# Patient Record
Sex: Female | Born: 1980 | Race: Black or African American | Hispanic: No | Marital: Single | Smoking: Former smoker
Health system: Southern US, Community
[De-identification: ages and names within clinical notes are randomized; demographics above are authoritative.]

## PROBLEM LIST (undated history)

## (undated) DIAGNOSIS — O24419 Gestational diabetes mellitus in pregnancy, unspecified control: Secondary | ICD-10-CM

## (undated) HISTORY — DX: Gestational diabetes mellitus in pregnancy, unspecified control: O24.419

---

## 2000-01-04 ENCOUNTER — Emergency Department (HOSPITAL_COMMUNITY): Admission: EM | Admit: 2000-01-04 | Discharge: 2000-01-05 | Payer: Self-pay | Admitting: Emergency Medicine

## 2000-03-14 ENCOUNTER — Emergency Department (HOSPITAL_COMMUNITY): Admission: EM | Admit: 2000-03-14 | Discharge: 2000-03-14 | Payer: Self-pay | Admitting: Emergency Medicine

## 2000-03-19 ENCOUNTER — Emergency Department (HOSPITAL_COMMUNITY): Admission: EM | Admit: 2000-03-19 | Discharge: 2000-03-19 | Payer: Self-pay | Admitting: Emergency Medicine

## 2000-04-29 ENCOUNTER — Emergency Department (HOSPITAL_COMMUNITY): Admission: EM | Admit: 2000-04-29 | Discharge: 2000-04-29 | Payer: Self-pay | Admitting: Emergency Medicine

## 2001-03-15 ENCOUNTER — Emergency Department (HOSPITAL_COMMUNITY): Admission: EM | Admit: 2001-03-15 | Discharge: 2001-03-15 | Payer: Self-pay | Admitting: Emergency Medicine

## 2001-09-04 ENCOUNTER — Emergency Department (HOSPITAL_COMMUNITY): Admission: EM | Admit: 2001-09-04 | Discharge: 2001-09-04 | Payer: Self-pay | Admitting: Emergency Medicine

## 2001-09-30 ENCOUNTER — Emergency Department (HOSPITAL_COMMUNITY): Admission: EM | Admit: 2001-09-30 | Discharge: 2001-09-30 | Payer: Self-pay | Admitting: Emergency Medicine

## 2001-10-13 ENCOUNTER — Emergency Department (HOSPITAL_COMMUNITY): Admission: EM | Admit: 2001-10-13 | Discharge: 2001-10-14 | Payer: Self-pay | Admitting: Emergency Medicine

## 2001-10-20 ENCOUNTER — Inpatient Hospital Stay (HOSPITAL_COMMUNITY): Admission: AD | Admit: 2001-10-20 | Discharge: 2001-10-20 | Payer: Self-pay | Admitting: *Deleted

## 2001-12-31 ENCOUNTER — Emergency Department (HOSPITAL_COMMUNITY): Admission: EM | Admit: 2001-12-31 | Discharge: 2001-12-31 | Payer: Self-pay | Admitting: Emergency Medicine

## 2002-01-23 ENCOUNTER — Emergency Department (HOSPITAL_COMMUNITY): Admission: EM | Admit: 2002-01-23 | Discharge: 2002-01-24 | Payer: Self-pay | Admitting: Emergency Medicine

## 2003-04-15 ENCOUNTER — Emergency Department (HOSPITAL_COMMUNITY): Admission: EM | Admit: 2003-04-15 | Discharge: 2003-04-15 | Payer: Self-pay | Admitting: Emergency Medicine

## 2003-08-30 ENCOUNTER — Inpatient Hospital Stay (HOSPITAL_COMMUNITY): Admission: AD | Admit: 2003-08-30 | Discharge: 2003-08-30 | Payer: Self-pay | Admitting: Gynecology

## 2007-11-26 ENCOUNTER — Emergency Department (HOSPITAL_COMMUNITY): Admission: EM | Admit: 2007-11-26 | Discharge: 2007-11-26 | Payer: Self-pay | Admitting: Emergency Medicine

## 2008-02-24 ENCOUNTER — Emergency Department (HOSPITAL_COMMUNITY): Admission: EM | Admit: 2008-02-24 | Discharge: 2008-02-24 | Payer: Self-pay | Admitting: Emergency Medicine

## 2008-02-25 ENCOUNTER — Emergency Department (HOSPITAL_COMMUNITY): Admission: EM | Admit: 2008-02-25 | Discharge: 2008-02-25 | Payer: Self-pay | Admitting: Emergency Medicine

## 2008-03-02 ENCOUNTER — Emergency Department (HOSPITAL_COMMUNITY): Admission: EM | Admit: 2008-03-02 | Discharge: 2008-03-02 | Payer: Self-pay | Admitting: Emergency Medicine

## 2008-03-22 ENCOUNTER — Encounter: Admission: RE | Admit: 2008-03-22 | Discharge: 2008-03-22 | Payer: Self-pay | Admitting: Emergency Medicine

## 2008-05-17 ENCOUNTER — Emergency Department (HOSPITAL_COMMUNITY): Admission: EM | Admit: 2008-05-17 | Discharge: 2008-05-17 | Payer: Self-pay | Admitting: Family Medicine

## 2008-10-12 ENCOUNTER — Emergency Department (HOSPITAL_COMMUNITY): Admission: EM | Admit: 2008-10-12 | Discharge: 2008-10-12 | Payer: Self-pay | Admitting: Family Medicine

## 2008-11-08 ENCOUNTER — Emergency Department (HOSPITAL_COMMUNITY): Admission: EM | Admit: 2008-11-08 | Discharge: 2008-11-08 | Payer: Self-pay | Admitting: Family Medicine

## 2008-12-10 ENCOUNTER — Emergency Department (HOSPITAL_COMMUNITY): Admission: EM | Admit: 2008-12-10 | Discharge: 2008-12-10 | Payer: Self-pay | Admitting: Family Medicine

## 2009-07-20 ENCOUNTER — Emergency Department (HOSPITAL_COMMUNITY): Admission: EM | Admit: 2009-07-20 | Discharge: 2009-07-20 | Payer: Self-pay | Admitting: Emergency Medicine

## 2009-11-18 IMAGING — CT CT ABDOMEN W/ CM
1 of 3 series · 14 of 32 positions shown, 19 images · IV contrast (APPLIED)
Comparison: None

CT ABDOMEN

CLINICAL DATA: Vomiting, lower abdominal pain.

CT ABDOMEN AND PELVIS WITH CONTRAST
TECHNIQUE: Multidetector CT imaging of the abdomen and pelvis was
performed using the standard protocol following bolus
administration of intravenous contrast.
Contrast: 100 ml 5mnipaque-UCC

[Series 2: abd/pelv with 5.0 b31f st · axial · 0.71mm/px · z∈[-436,-36]mm · 14 of 90 slices shown, 19 images]
[im 5/90  soft-tissue]
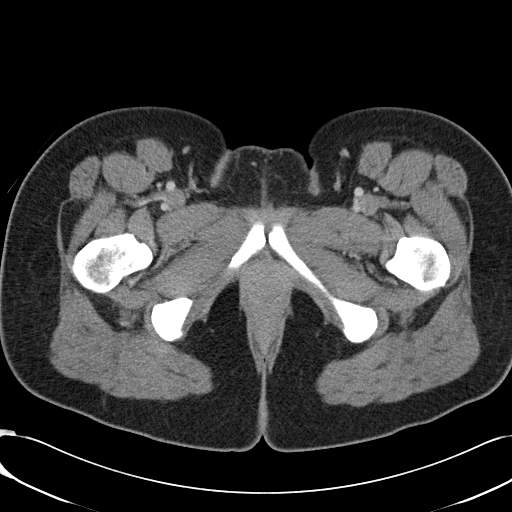
[im 5/90  bone]
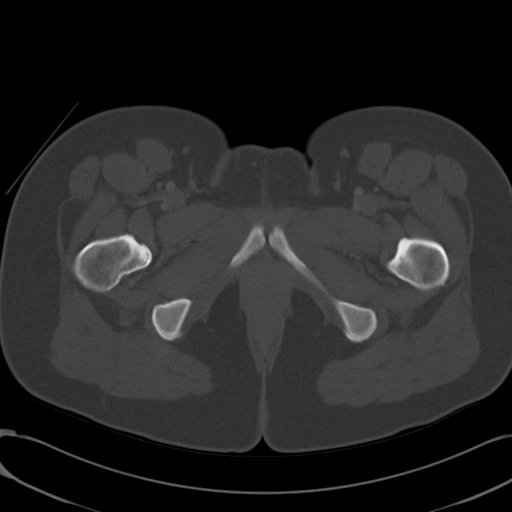
[im 14/90  soft-tissue]
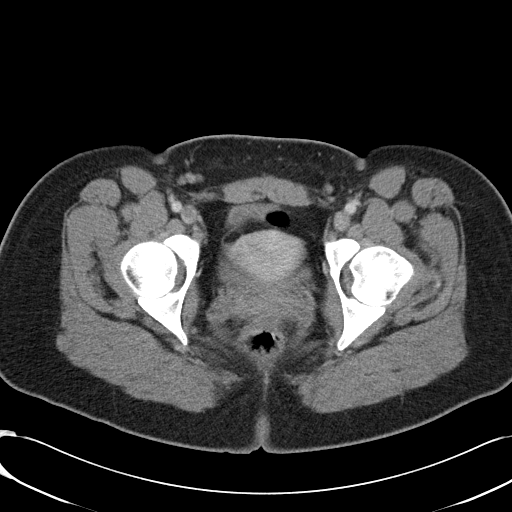
[im 18/90  soft-tissue]
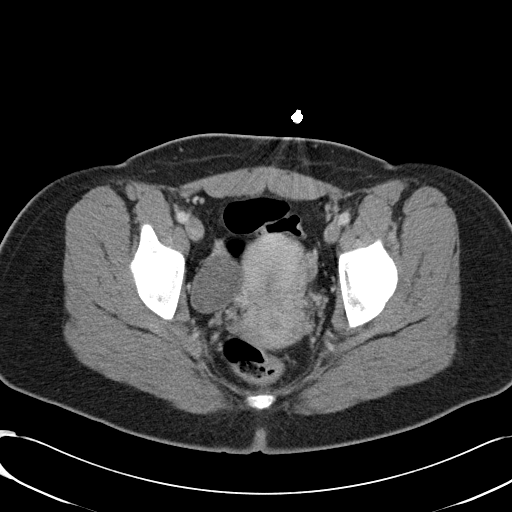
[im 27/90  soft-tissue]
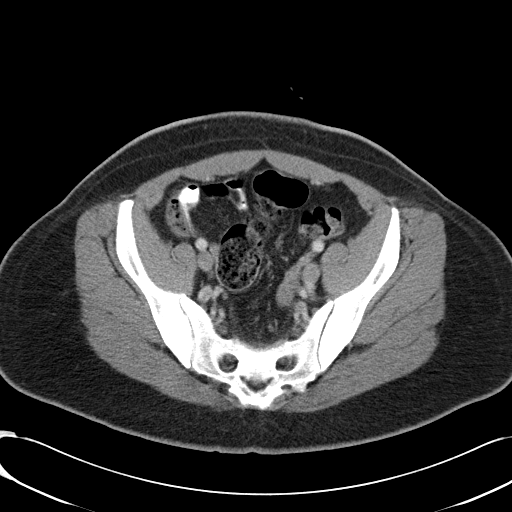
[im 32/90  soft-tissue]
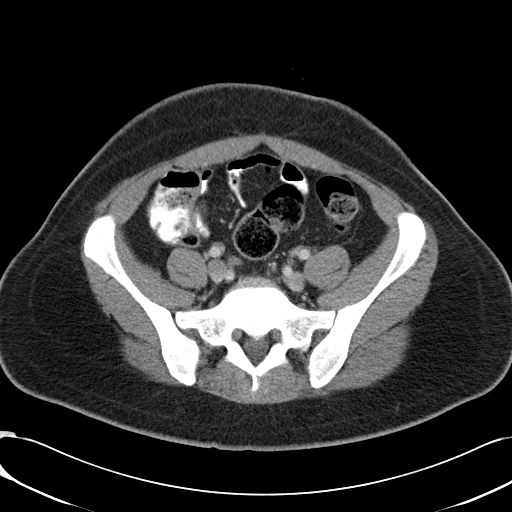
[im 41/90  soft-tissue]
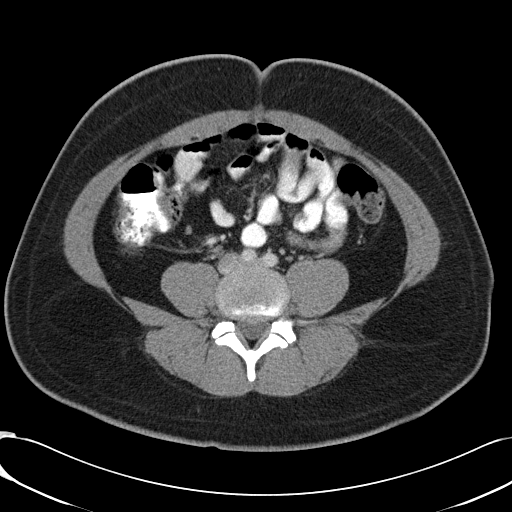
[im 45/90  soft-tissue]
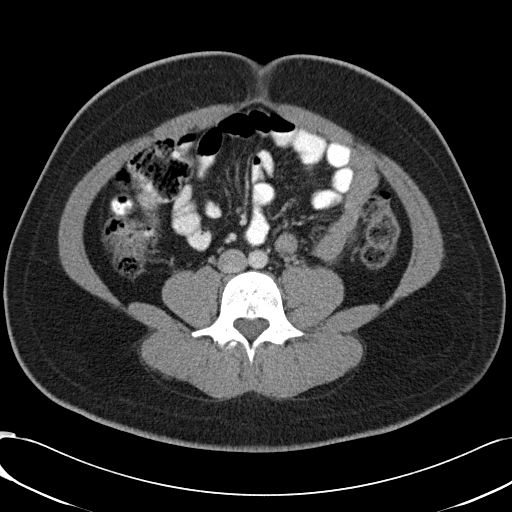
[im 49/90  soft-tissue]
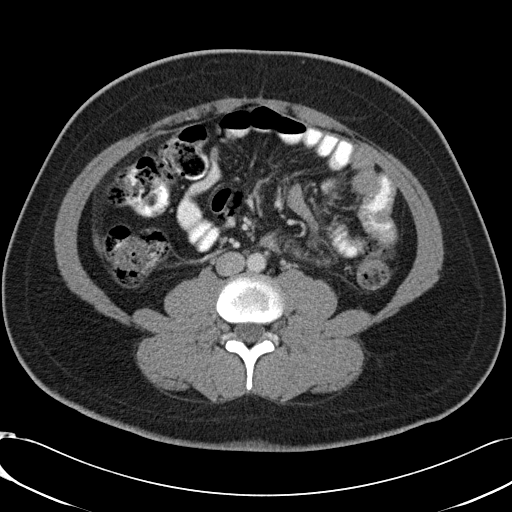
[im 58/90  soft-tissue]
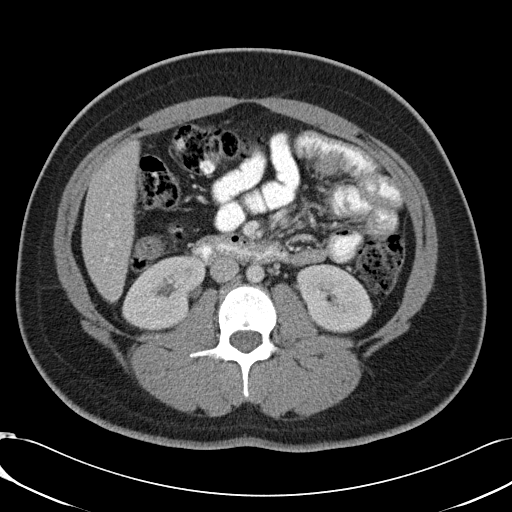
[im 58/90  bone]
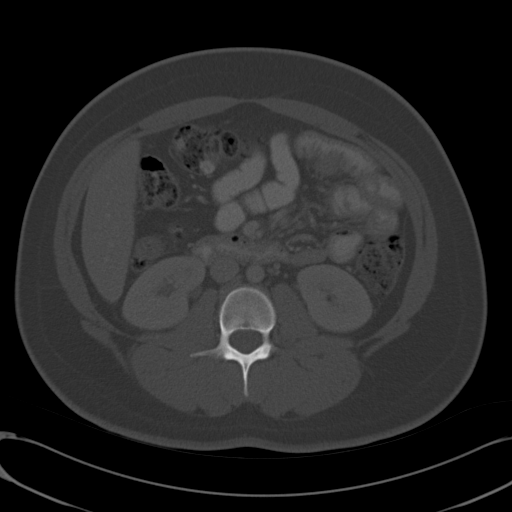
[im 63/90  soft-tissue]
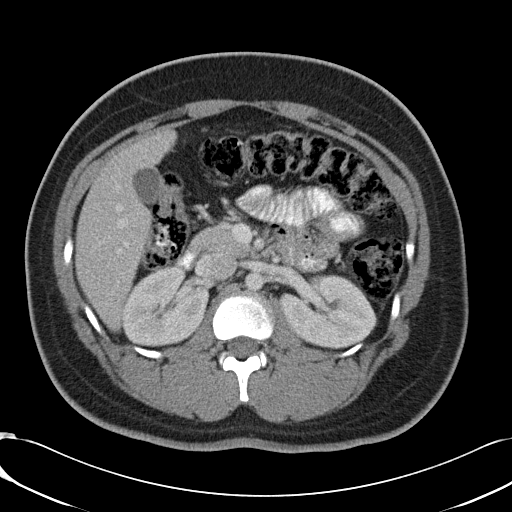
[im 72/90  soft-tissue]
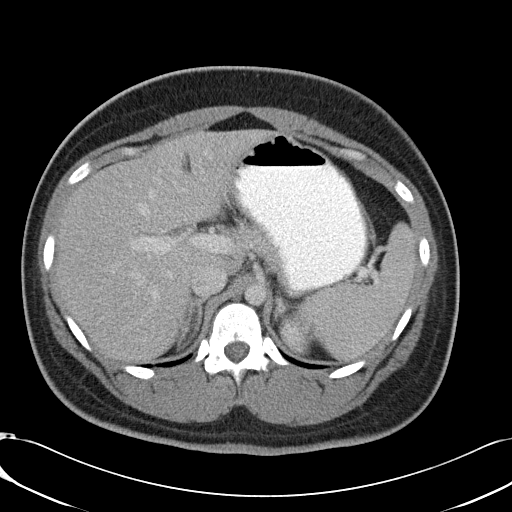
[im 72/90  lung]
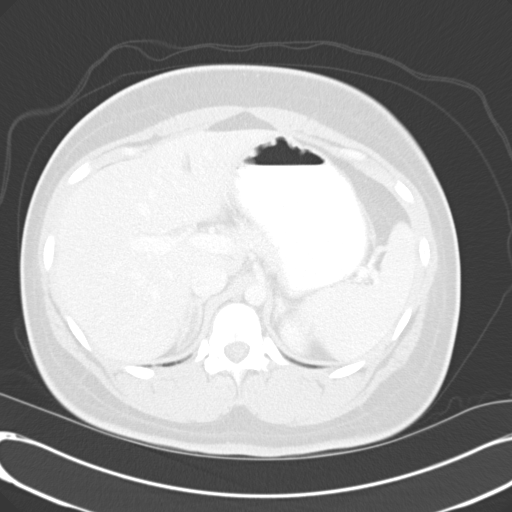
[im 76/90  soft-tissue]
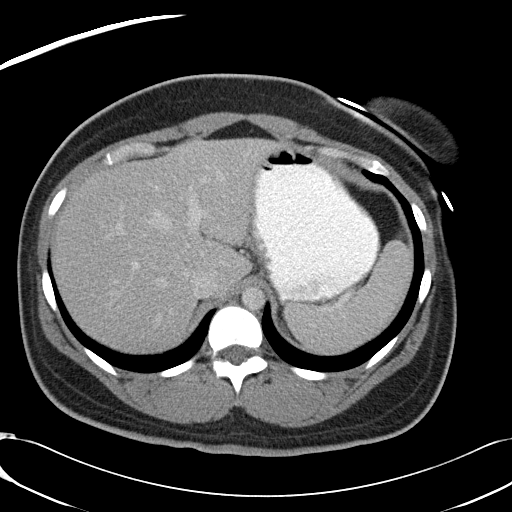
[im 76/90  lung]
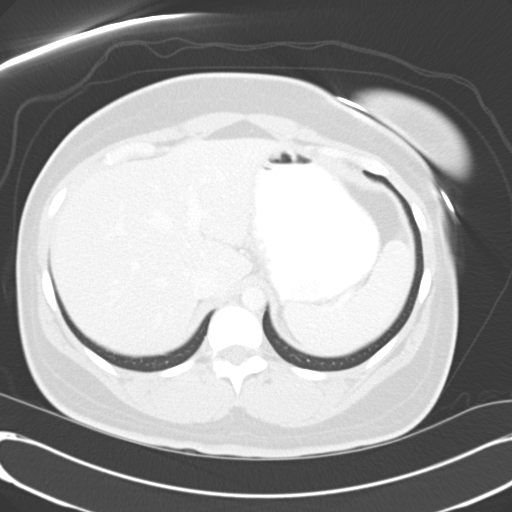
[im 81/90  lung]
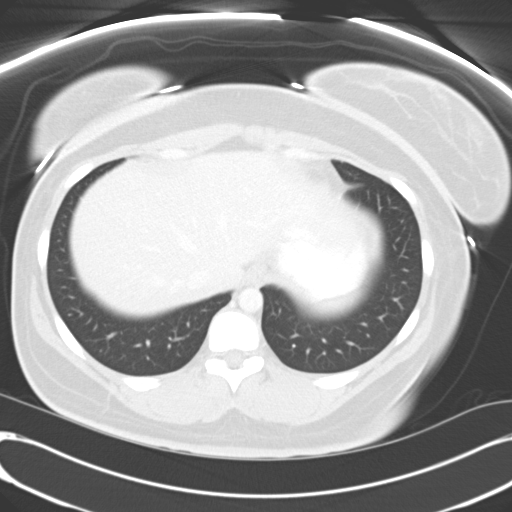
[im 85/90  soft-tissue]
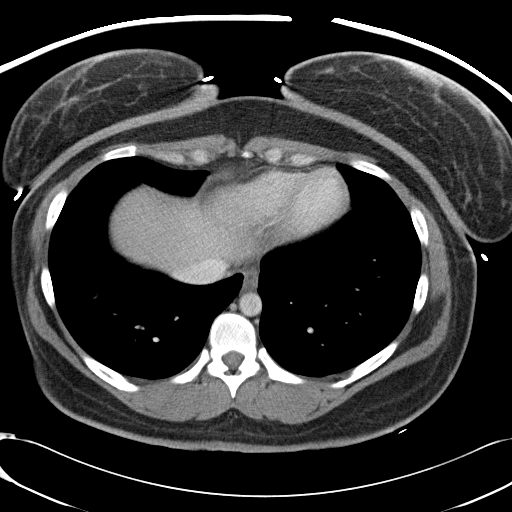
[im 85/90  lung]
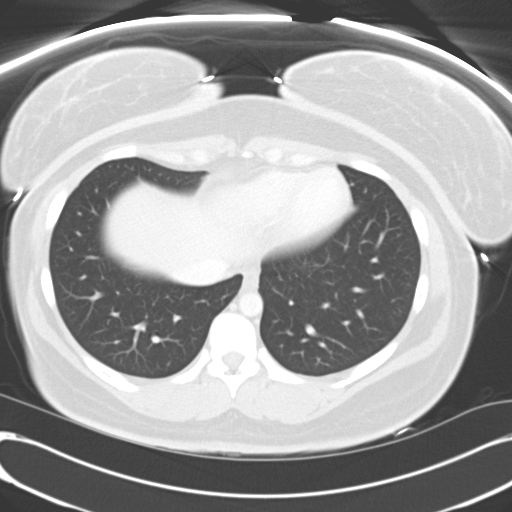

[14 of 32 positions shown; findings below may reference images not displayed]

FINDINGS: Liver, spleen, pancreas, adrenals, kidneys unremarkable.
No hydronephrosis. No biliary ductal dilatation. Gallbladder
unremarkable

Bowel grossly unremarkable.  No free fluid, free air, or
adenopathy. Aorta is normal caliber. Lung bases are clear.  No
effusions.  Heart is normal size. No acute bony abnormality.
IMPRESSION: Unremarkable CT abdomen

CT PELVIS
FINDINGS: Appendix is visualized and is normal. Bowel grossly
unremarkable.  No free fluid, free air, or adenopathy. Bilateral
ovarian cysts present, 4.1 cm on the right and 3.6 cm on the left.
Uterus unremarkable.

No acute bony abnormality.
IMPRESSION: Bilateral ovarian cysts.  These can be followed with pelvic
ultrasound in one to two cycles.

No acute findings.  Appendix normal.

## 2010-04-03 ENCOUNTER — Emergency Department (HOSPITAL_COMMUNITY)
Admission: EM | Admit: 2010-04-03 | Discharge: 2010-04-03 | Disposition: A | Discharge status: Home / Self Care | Attending: Emergency Medicine | Admitting: Emergency Medicine

## 2010-04-03 DIAGNOSIS — X58XXXA Exposure to other specified factors, initial encounter: Secondary | ICD-10-CM | POA: Insufficient documentation

## 2010-04-03 DIAGNOSIS — S335XXA Sprain of ligaments of lumbar spine, initial encounter: Secondary | ICD-10-CM | POA: Insufficient documentation

## 2010-04-03 DIAGNOSIS — Y929 Unspecified place or not applicable: Secondary | ICD-10-CM | POA: Insufficient documentation

## 2010-05-12 LAB — POCT URINALYSIS DIP (DEVICE)
Bilirubin Urine: NEGATIVE
Glucose, UA: NEGATIVE mg/dL
Hgb urine dipstick: NEGATIVE
Nitrite: NEGATIVE
Protein, ur: NEGATIVE mg/dL

## 2010-05-12 LAB — POCT PREGNANCY, URINE: Preg Test, Ur: NEGATIVE

## 2010-07-07 HISTORY — PX: BREAST REDUCTION SURGERY: SHX8

## 2010-08-02 ENCOUNTER — Inpatient Hospital Stay (INDEPENDENT_AMBULATORY_CARE_PROVIDER_SITE_OTHER)
Admission: RE | Admit: 2010-08-02 | Discharge: 2010-08-02 | Disposition: A | Discharge status: Home / Self Care | Source: Ambulatory Visit | Attending: Emergency Medicine | Admitting: Emergency Medicine

## 2010-08-02 DIAGNOSIS — H9209 Otalgia, unspecified ear: Secondary | ICD-10-CM

## 2010-11-07 LAB — URINALYSIS, ROUTINE W REFLEX MICROSCOPIC
Bilirubin Urine: NEGATIVE
Ketones, ur: NEGATIVE
Nitrite: NEGATIVE
Protein, ur: NEGATIVE
Specific Gravity, Urine: 1.025
Urobilinogen, UA: 1
pH: 6.5

## 2010-11-07 LAB — DIFFERENTIAL
Basophils Absolute: 0
Eosinophils Relative: 2
Lymphocytes Relative: 13
Lymphs Abs: 1
Monocytes Absolute: 0.6
Monocytes Relative: 8

## 2010-11-07 LAB — BASIC METABOLIC PANEL
CO2: 27
Creatinine, Ser: 0.81
GFR calc Af Amer: >60
Glucose, Bld: 100 — ABNORMAL HIGH
Sodium: 140

## 2010-11-07 LAB — CBC
Hemoglobin: 11.1 — ABNORMAL LOW
MCV: 74.9 — ABNORMAL LOW
Platelets: 258
RBC: 4.64
WBC: 8

## 2010-11-07 LAB — HEPATIC FUNCTION PANEL
ALT: 9
Alkaline Phosphatase: 56
Bilirubin, Direct: <0.1

## 2011-02-11 ENCOUNTER — Encounter: Payer: Self-pay | Admitting: Emergency Medicine

## 2011-02-11 ENCOUNTER — Emergency Department (INDEPENDENT_AMBULATORY_CARE_PROVIDER_SITE_OTHER)
Admission: EM | Admit: 2011-02-11 | Discharge: 2011-02-11 | Disposition: A | Discharge status: Home / Self Care | Source: Home / Self Care | Attending: Emergency Medicine | Admitting: Emergency Medicine

## 2011-02-11 DIAGNOSIS — S335XXA Sprain of ligaments of lumbar spine, initial encounter: Secondary | ICD-10-CM

## 2011-02-11 DIAGNOSIS — S39012A Strain of muscle, fascia and tendon of lower back, initial encounter: Secondary | ICD-10-CM

## 2011-02-11 MED ORDER — CYCLOBENZAPRINE HCL 5 MG PO TABS: 5.0000 mg | ORAL_TABLET | Freq: Three times a day (TID) | ORAL | Status: AC | PRN

## 2011-02-11 MED ORDER — DICLOFENAC SODIUM 75 MG PO TBEC: 75.0000 mg | DELAYED_RELEASE_TABLET | Freq: Two times a day (BID) | ORAL | Status: AC

## 2011-02-11 NOTE — ED Notes (Signed)
Here with low/mid back pain that flared up this am.pt s/p breast reduction 6/`01/2011 and states i always had back pain until the surgery.c/o intermitt crampy pain.denies numb/tingling.pt took tylenol for discomfort but no relief

## 2011-02-11 NOTE — ED Provider Notes (Signed)
History     CSN: [id number]  Arrival date & time 02/11/11  1653   First MD Initiated Contact with Patient 02/11/11 1717      Chief Complaint  Patient presents with  . Back Pain    (Consider location/radiation/quality/duration/timing/severity/associated sxs/prior treatment) HPI Comments: Marissa Jacobson has a history of lower back pain since this morning. She denies any injury, she just woke up with pain. It's worse if she stands up and better if she stretches. There is no radiation down the legs. No numbness, tingling, weakness, bladder, or bowel complaints.  Patient is a 31 y.o. female presenting with back pain.  Back Pain  Pertinent negatives include no fever, no numbness, no abdominal pain, no dysuria and no weakness.    History reviewed. No pertinent past medical history.  Past Surgical History  Procedure Date  . Breast reduction surgery 07/2010    History reviewed. No pertinent family history.  History  Substance Use Topics  . Smoking status: Never Smoker   . Smokeless tobacco: Not on file  . Alcohol Use: Yes    OB History    Grav Para Term Preterm Abortions TAB SAB Ect Mult Living                  Review of Systems  Constitutional: Negative for fever, chills and unexpected weight change.  Gastrointestinal: Negative for abdominal pain.  Genitourinary: Negative for dysuria, urgency, frequency and difficulty urinating.  Musculoskeletal: Positive for back pain. Negative for myalgias, joint swelling, arthralgias and gait problem.  Neurological: Negative for weakness and numbness.    Allergies  Review of patient's allergies indicates no known allergies.  Home Medications   Current Outpatient Rx  Name Route Sig Dispense Refill  . CYCLOBENZAPRINE HCL 5 MG PO TABS Oral Take 1 tablet (5 mg total) by mouth 3 (three) times daily as needed for muscle spasms. 30 tablet 0  . DICLOFENAC SODIUM 75 MG PO TBEC Oral Take 1 tablet (75 mg total) by mouth 2 (two) times daily. 20  tablet 0    BP 129/73  Pulse 93  Temp(Src) 98.1 F (36.7 C) (Oral)  Resp 16  SpO2 100%  LMP 02/06/2011  Physical Exam  Nursing note and vitals reviewed. Constitutional: She is oriented to person, place, and time. She appears well-developed and well-nourished.  Abdominal: Soft. Bowel sounds are normal. She exhibits no distension, no abdominal bruit, no pulsatile midline mass and no mass. There is no tenderness. There is no rebound and no guarding.  Musculoskeletal: She exhibits no edema and no tenderness.       Lumbar back: She exhibits decreased range of motion, tenderness, bony tenderness and pain. She exhibits no swelling, no edema, no deformity, no spasm and normal pulse.       Back was nontender to palpation and had a reasonably good range of motion but with pain. Straight leg raising was negative, DTRs are normal, muscle strength and sensation were normal.  Neurological: She is alert and oriented to person, place, and time. She has normal reflexes. She displays no atrophy. No sensory deficit. She exhibits normal muscle tone. Coordination and gait normal.  Skin: Skin is warm and dry. No rash noted. She is not diaphoretic.    ED Course  Procedures (including critical care time)  Labs Reviewed - No data to display No results found.   1. Lumbar strain       MDM          Roque Lias, MD 02/11/11  2024 

## 2011-06-28 ENCOUNTER — Emergency Department (INDEPENDENT_AMBULATORY_CARE_PROVIDER_SITE_OTHER)
Admission: EM | Admit: 2011-06-28 | Discharge: 2011-06-28 | Disposition: A | Discharge status: Home / Self Care | Source: Home / Self Care

## 2011-06-28 ENCOUNTER — Encounter (HOSPITAL_COMMUNITY): Payer: Self-pay | Admitting: Emergency Medicine

## 2011-06-28 DIAGNOSIS — K089 Disorder of teeth and supporting structures, unspecified: Secondary | ICD-10-CM

## 2011-06-28 DIAGNOSIS — K0889 Other specified disorders of teeth and supporting structures: Secondary | ICD-10-CM

## 2011-06-28 DIAGNOSIS — J020 Streptococcal pharyngitis: Secondary | ICD-10-CM

## 2011-06-28 MED ORDER — PENICILLIN V POTASSIUM 500 MG PO TABS: 500.0000 mg | ORAL_TABLET | Freq: Four times a day (QID) | ORAL | Status: AC

## 2011-06-28 NOTE — ED Notes (Signed)
Sore throat onset Wednesday am.  Today has noticed cough. Reports sinus drainage down back of throat.  Reports toothache onset Monday, has an appt with dentist at 9:00 tomorrow morning.  Dentist is in [employer]

## 2011-06-28 NOTE — ED Provider Notes (Signed)
History     CSN: [id number]  Arrival date & time 06/28/11  1148   None     Chief Complaint  Patient presents with  . Sore Throat    (Consider location/radiation/quality/duration/timing/severity/associated sxs/prior treatment) Patient is a 31 y.o. female presenting with pharyngitis. The history is provided by the patient.  Sore Throat This is a new problem. The current episode started yesterday. The problem occurs constantly. The problem has not changed since onset.The symptoms are aggravated by nothing. The symptoms are relieved by nothing. Treatments tried: sudafed and throat lozenges. The treatment provided moderate (transient improvement) relief.    History reviewed. No pertinent past medical history.  Past Surgical History  Procedure Date  . Breast reduction surgery 07/2010    History reviewed. No pertinent family history.  History  Substance Use Topics  . Smoking status: Never Smoker   . Smokeless tobacco: Not on file  . Alcohol Use: Yes    OB History    Grav Para Term Preterm Abortions TAB SAB Ect Mult Living                  Review of Systems  Constitutional: Negative for fever and chills.  HENT: Positive for sore throat, dental problem and postnasal drip. Negative for ear pain, congestion, sinus pressure and ear discharge.        Pt incidentally has toothache; is seeing dentist tomorrow  Respiratory: Positive for cough.     Allergies  Review of patient's allergies indicates no known allergies.  Home Medications   Current Outpatient Rx  Name Route Sig Dispense Refill  . EXCEDRIN PO Oral Take by mouth.    Juanita Laster COLD & COUGH PO Oral Take by mouth.    Marland Kitchen VITAMIN C 500 MG PO TABS Oral Take 500 mg by mouth daily.    Marland Kitchen DICLOFENAC SODIUM 75 MG PO TBEC Oral Take 1 tablet (75 mg total) by mouth 2 (two) times daily. 20 tablet 0  . PENICILLIN V POTASSIUM 500 MG PO TABS Oral Take 1 tablet (500 mg total) by mouth 4 (four) times daily. 40 tablet 0    BP  113/78  Pulse 106  Temp(Src) 99.1 F (37.3 C) (Oral)  Resp 18  SpO2 100%  LMP 06/21/2011  Physical Exam  Constitutional: She appears well-developed and well-nourished. No distress.  HENT:  Right Ear: Tympanic membrane, external ear and ear canal normal.  Left Ear: Tympanic membrane, external ear and ear canal normal.  Nose: No mucosal edema or rhinorrhea. Right sinus exhibits no maxillary sinus tenderness and no frontal sinus tenderness. Left sinus exhibits no maxillary sinus tenderness and no frontal sinus tenderness.  Mouth/Throat: Uvula is midline. Oropharyngeal exudate and posterior oropharyngeal erythema present.       No obvious source of pt's dental pain in inspection; tooth in question is R upper first premolar; no edema, erythema of gums, no sign infection.   Cardiovascular: Normal rate and regular rhythm.   Pulmonary/Chest: Effort normal and breath sounds normal.  Lymphadenopathy:       Head (right side): No submental, no submandibular and no tonsillar adenopathy present.       Head (left side): No submental, no submandibular and no tonsillar adenopathy present.    ED Course  Procedures (including critical care time)  Labs Reviewed  POCT RAPID STREP A ([facility] CARE ONLY) - Abnormal; Notable for the following:    Streptococcus, Group A Screen (Direct) POSITIVE (*)    All other components within normal  limits   No results found.   1. Strep pharyngitis   2. Toothache       MDM          Cathlyn Parsons, NP 06/28/11 1324

## 2011-06-28 NOTE — ED Notes (Signed)
Patient complains of pain (tooth and throat). Requesting pain medicine.  Notified angle kabbe, np.  Will see patient then order pain medicine. Notified patient she is next to be seen.

## 2011-06-28 NOTE — Discharge Instructions (Signed)
Finish all of your antibiotics, even if you are feeling better.  Change your toothbrush after you have been on the antibiotics for 2 days.   Strep Throat Strep throat is an infection of the throat. It is caused by a germ. Strep throat spreads from person to person by coughing, sneezing, or close contact. HOME CARE  Rinse your mouth (gargle) with warm salt water (1 teaspoon salt in 1 cup of water). Do this 3 to 4 times per day or as needed for comfort.   Family members with a sore throat or fever should see a doctor.   Make sure everyone in your house washes their hands well.   Do not share food, drinking cups, or personal items.   Eat soft foods until your sore throat gets better.   Drink enough water and fluids to keep your pee (urine) clear or pale yellow.   Rest.   Stay home from school, daycare, or work until you have taken medicine for 24 hours.   Only take medicine as told by your doctor.   Take your medicine as told. Finish it even if you start to feel better.  GET HELP RIGHT AWAY IF:   You have new problems, such as throwing up (vomiting) or bad headaches.   You have a stiff or painful neck, chest pain, trouble breathing, or trouble swallowing.   You have very bad throat pain, drooling, or changes in your voice.   Your neck puffs up (swells) or gets red and tender.   You have a fever.   You are very tired, your mouth is dry, or you are peeing less than normal.   You cannot wake up completely.   You get a rash, cough, or earache.   You have green, yellow-brown, or bloody spit.   Your pain does not get better with medicine.  MAKE SURE YOU:   Understand these instructions.   Will watch your condition.   Will get help right away if you are not doing well or get worse.  Document Released: 07/11/2007 Document Revised: 01/11/2011 Document Reviewed: 03/23/2010 [facility] Patient Information 2012 [place], [place].

## 2011-06-28 NOTE — ED Notes (Signed)
Marissa Land, np going into treatment room

## 2011-06-28 NOTE — ED Notes (Signed)
Patient in hall, asking is she going to be helped.  Explained delay.  Reassured she would be seen.  Patient angry, slammed door to room.

## 2011-07-19 NOTE — ED Provider Notes (Signed)
Medical screening examination/treatment/procedure(s) were performed by resident physician or non-physician practitioner and as supervising physician I was immediately available for consultation/collaboration.   Barkley Bruns MD.    Linna Hoff, MD 07/19/11 [phone]

## 2011-11-22 ENCOUNTER — Emergency Department (HOSPITAL_COMMUNITY)
Admission: EM | Admit: 2011-11-22 | Discharge: 2011-11-22 | Discharge status: Against Medical Advice | Payer: Self-pay | Source: Home / Self Care | Attending: Emergency Medicine | Admitting: Emergency Medicine

## 2012-01-23 ENCOUNTER — Emergency Department (INDEPENDENT_AMBULATORY_CARE_PROVIDER_SITE_OTHER)
Admission: EM | Admit: 2012-01-23 | Discharge: 2012-01-23 | Disposition: A | Discharge status: Home / Self Care | Source: Home / Self Care

## 2012-01-23 ENCOUNTER — Encounter (HOSPITAL_COMMUNITY): Payer: Self-pay

## 2012-01-23 DIAGNOSIS — B349 Viral infection, unspecified: Secondary | ICD-10-CM

## 2012-01-23 DIAGNOSIS — J111 Influenza due to unidentified influenza virus with other respiratory manifestations: Secondary | ICD-10-CM

## 2012-01-23 DIAGNOSIS — B9789 Other viral agents as the cause of diseases classified elsewhere: Secondary | ICD-10-CM

## 2012-01-23 NOTE — ED Notes (Signed)
C/o HA, cough, dizzy, body aches since yesterday; minimal relief w OTC medications

## 2012-01-23 NOTE — ED Provider Notes (Signed)
Medical screening examination/treatment/procedure(s) were performed by non-physician practitioner and as supervising physician I was immediately available for consultation/collaboration.  Raynald Blend, MD 01/23/12 [phone]

## 2012-01-23 NOTE — ED Provider Notes (Signed)
History     CSN: [id number]  Arrival date & time 01/23/12  1043   None     Chief Complaint  Patient presents with  . Cough    (Consider location/radiation/quality/duration/timing/severity/associated sxs/prior treatment) HPI Comments: 31 year old female who developed chills yesterday she has throat pain with the cough but denies sore throat. She feels achy all over lightheaded and dizzy. Just has a mild headache but denies earache or any documented fever. Even though the symptoms began to develop yesterday she received a flu shot yesterday. She denies GI symptoms, no vomiting or diarrhea. She is able to drink and hold fluids down.   History reviewed. No pertinent past medical history.  Past Surgical History  Procedure Date  . Breast reduction surgery 07/2010    History reviewed. No pertinent family history.  History  Substance Use Topics  . Smoking status: Never Smoker   . Smokeless tobacco: Not on file  . Alcohol Use: Yes    OB History    Grav Para Term Preterm Abortions TAB SAB Ect Mult Living                  Review of Systems  Constitutional: Positive for fever, chills and activity change.  HENT: Negative for ear pain, congestion, sore throat, facial swelling, neck pain and neck stiffness.        See history of present illness  Respiratory: Positive for cough. Negative for choking, chest tightness, shortness of breath and wheezing.   Cardiovascular: Negative.   Gastrointestinal: Negative.   Genitourinary: Negative.  Negative for dysuria, frequency, vaginal bleeding, difficulty urinating and pelvic pain.  Musculoskeletal: Positive for myalgias.  Neurological: Negative.   Psychiatric/Behavioral: Negative.     Allergies  Review of patient's allergies indicates no known allergies.  Home Medications   Current Outpatient Rx  Name  Route  Sig  Dispense  Refill  . EXCEDRIN PO   Oral   Take by mouth.         . DICLOFENAC SODIUM 75 MG PO TBEC   Oral   Take  1 tablet (75 mg total) by mouth 2 (two) times daily.   20 tablet   0   . THERAFLU COLD & COUGH PO   Oral   Take by mouth.         Marland Kitchen VITAMIN C 500 MG PO TABS   Oral   Take 500 mg by mouth daily.           BP 117/75  Pulse 120  Temp 100.4 F (38 C) (Oral)  Resp 20  SpO2 98%  LMP 01/15/2012  Physical Exam  Nursing note and vitals reviewed. Constitutional: She is oriented to person, place, and time. She appears well-developed and well-nourished. No distress.  HENT:  Mouth/Throat: No oropharyngeal exudate.       Lateral TMs are normal Oropharynx with minor erythema but no exudates, swelling or other lesions.  Eyes: Conjunctivae normal and EOM are normal.  Neck: Normal range of motion. Neck supple.  Cardiovascular: Normal rate.        Tachycardia  Pulmonary/Chest: Effort normal and breath sounds normal. No respiratory distress. She has no wheezes. She has no rales.       The lung sounds are perfectly clear there are no adventitious sounds even with forced expiration and cough. Air movement is brisk, expiratory phase equals inspiratory phase. Clear breath sounds are heard throughout bilateral lung fields. No areas of diminished breath sounds.  Abdominal: Soft. There is no  tenderness.  Musculoskeletal: Normal range of motion. She exhibits no edema and no tenderness.  Lymphadenopathy:    She has no cervical adenopathy.  Neurological: She is alert and oriented to person, place, and time.  Skin: Skin is warm and dry. No rash noted.  Psychiatric: She has a normal mood and affect.    ED Course  Procedures (including critical care time)  Labs Reviewed - No data to display No results found.   1. Influenza-like illness   2. Viral syndrome       MDM  Orthostatic vital signs indicate mild increase in pulse rate upon standing. I believe this is due to a mild dehydration. She has no subjective or objective signs or symptoms of lower respiratory infection. She is able to hold  down fluids today she does not have any GI symptoms. It has been recommended that she obtain up to 6 quarts of Gatorade and he drank 2 quarts of Gatorade a day to get hydrated. She will get better sooner if she does this. She may take NyQuil for symptom relief and fever control. Is instructed that if she gets worse develops a high fever, shortness of breath, cough or inability to hold down fluids she should go to the emergency department. She may develop greater dehydration and need IV fluids. Since she is able to drink and without GI symptoms it is reasonable to send her home to rehydrate orally. Put her out of work for the next 3 days.          Hayden Rasmussen, NP 01/23/12 1404  Hayden Rasmussen, NP 01/23/12 1409

## 2012-11-26 LAB — OB RESULTS CONSOLE GC/CHLAMYDIA
Chlamydia: NEGATIVE
Gonorrhea: NEGATIVE

## 2012-11-26 LAB — OB RESULTS CONSOLE HEPATITIS B SURFACE ANTIGEN: HEP B S AG: NEGATIVE

## 2012-11-26 LAB — OB RESULTS CONSOLE ABO/RH: RH TYPE: POSITIVE

## 2012-11-26 LAB — OB RESULTS CONSOLE HIV ANTIBODY (ROUTINE TESTING): HIV: NONREACTIVE

## 2012-11-26 LAB — OB RESULTS CONSOLE RUBELLA ANTIBODY, IGM: RUBELLA: IMMUNE

## 2012-11-26 LAB — OB RESULTS CONSOLE ANTIBODY SCREEN: Antibody Screen: NEGATIVE

## 2013-02-03 LAB — OB RESULTS CONSOLE RPR: RPR: NONREACTIVE

## 2013-02-03 LAB — OB RESULTS CONSOLE HGB/HCT, BLOOD
HEMATOCRIT: 29 %
Hemoglobin: 9.2 g/dL

## 2013-02-05 NOTE — L&D Delivery Note (Signed)
Cesarean Section Procedure Note  Indications: 33 yo G1P0 at 40 weeks 2 days with Non-Reassuring fetal heart tracing in second stage of labor.  Arrest of Descent at full dilation.   Pre-operative Diagnosis: Non Reassuring Fetal Heart Tracing, Arrest of Descent  Post-operative Diagnosis: same  Surgeon: Wynonia Hazard   Assistants: Jaynie Collins   Anesthesia: Epidural anesthesia  ASA Class: 2  Procedure Details   The patient was taken to the operating Room from Labor and Delivery. Previously on L&D The risks, benefits, complications, treatment options, and expected outcomes were discussed with the patient.  The patient concurred with the proposed plan, giving informed consent.  The site of surgery properly noted/marked. The patient was taken to identified as Bosie Clos and the procedure verified as C-Section Delivery. A Time Out was held and the above information confirmed.  After confirmation of the level of the epidural, the patient was placed in the dorsal supine position with a leftward tilt, draped and prepped in the usual sterile manner. A Pfannenstiel incision was made with the scalpel and carried down through the subcutaneous tissue to the fascia with the scalpel.  The fascia was incised in the midline and the fascial incision was extended laterally with Mayo scissors. The superior aspect of the fascial incision was grasped with Coker clamps x2, tented up and the rectus muscles dissected off sharply with the scalpel. The rectus was then dissected off with blunt dissection and Mayo scissors inferiorly. The rectus muscles were separated in the midline. The abdominal peritoneum was identified, tented up, entered sharply, and the incision was extended superiorly and inferiorly with good visualization of the bladder. The bowel was in the operative field and packed away with tagged moist laparotomy sponges x 2. The Alexis retractor was then deployed. The vesicouterine peritoneum was  identified, tented up, entered sharply with Metzenbaum scissors, and the bladder flap was created digitally. Scalpel was then used to make a low transverse incision on the uterus which was extended laterally with  blunt dissection. The fetal vertex was wedged in the maternal pelvis and the assisting circulating nurse gave assistance with a vaginal hand to lift the head out of the pelvis. The fetal vertex was then delivered through the abdomen. Four nuchal cords were reduced (two were tight) the rest of the body delivered without difficulty. A live female infant was born, limp and not crying. The cord was double clamped and cut and the infant handed to the waiting neonatologists. There was notable meconium not previously identified on AROM during labor. identified, delivered easily through the uterine incision followed by the body. Final Apgars 2/ 7/8. Cord gases were obtained and cord blood for typing. The Placenta was then delivered spontaneously, intact and appear normal and handed off to be sent to Pathology. The uterus was exteriorized,  cleared of all clot and debris. The uterine incision was repaired with #1 Monocryl in running locked fashion. There was an extension noted into the left broad ligament that was repaired in running, locked fashion with #0 Monocryl. A second imbricating suture of 0-Monocryl was performed.  There was oozing along the medial aspect of the incision which was alleviated by interrupted suture of 2-0 Vicryl. The ovaries and tubes were inspected and normal. The uterus was returned to the abdomen and the lap packs removed. The Alexis retractor was removed.  The abdominal peritoneum was reapproximated with 2-0 Vicryl in a running fashion. The fascia was closed in running fashion with 0-Vicryl. The subcutaneous layer was re-approximated  with inturrupted suture of 2-0 plain gut.  The skin was closed with 4-0 vicryl in a subcuticular fashion and dressed with benzoine, steri strips and pressure  dressing. All sponge lap and needle counts were correct x3. Patient tolerated the procedure well and was transferred to the PACU in good condition. Instrument, sponge, and needle counts were correct prior the abdominal closure and at the conclusion of the case.   Findings: Live female infant, Apgars2/7/8, thick meconium, normal appearing placenta, normal uterus, bilateral tubes and ovaries. Infant to NICU  Estimated Blood Loss:          Drains: Foley catheter                 Specimens: Placenta, cord gases         Implants: none         Complications:  None; patient tolerated the procedure well.         Disposition: PACU - hemodynamically stable.

## 2013-02-16 ENCOUNTER — Ambulatory Visit (INDEPENDENT_AMBULATORY_CARE_PROVIDER_SITE_OTHER): Payer: Self-pay | Admitting: Pediatrics

## 2013-02-16 DIAGNOSIS — Z7189 Other specified counseling: Secondary | ICD-10-CM | POA: Insufficient documentation

## 2013-02-16 DIAGNOSIS — Z7681 Expectant parent(s) prebirth pediatrician visit: Secondary | ICD-10-CM | POA: Insufficient documentation

## 2013-02-16 NOTE — Progress Notes (Signed)
Prenatal counseling for impending newborn done  

## 2013-02-18 ENCOUNTER — Encounter: Attending: Obstetrics and Gynecology

## 2013-02-18 VITALS — Ht 69.0 in | Wt 222.0 lb

## 2013-02-18 DIAGNOSIS — Z713 Dietary counseling and surveillance: Secondary | ICD-10-CM | POA: Insufficient documentation

## 2013-02-18 DIAGNOSIS — O99810 Abnormal glucose complicating pregnancy: Secondary | ICD-10-CM | POA: Insufficient documentation

## 2013-02-19 NOTE — Progress Notes (Signed)
  Patient was seen on 02/13/13 for Gestational Diabetes self-management class at the [facility]. The following learning objectives were met by the patient during this course:   States the definition of Gestational Diabetes  States why dietary management is important in controlling blood glucose  Describes the effects of carbohydrates on blood glucose levels  Demonstrates ability to create a balanced meal plan  Demonstrates carbohydrate counting   States when to check blood glucose levels  Demonstrates proper blood glucose monitoring techniques  States the effect of stress and exercise on blood glucose levels  States the importance of limiting caffeine and abstaining from alcohol and smoking  Plan:  Aim for 2 Carb Choices per meal (30 grams) +/- 1 either way for breakfast Aim for 3 Carb Choices per meal (45 grams) +/- 1 either way from lunch and dinner Aim for 1-2 Carbs per snack Begin reading food labels for Total Carbohydrate and sugar grams of foods Consider  increasing your activity level by walking daily as tolerated Begin checking BG before breakfast and 1-2 hours after first bit of breakfast, lunch and dinner after  as directed by MD  Take medication  as directed by MD  Blood glucose monitor: Recommend Accu Ck Aviva Plus per [employer] Blood glucose reading: $RemoveBeforeDE'87mg'[id number]$ /dl  Patient instructed to monitor glucose levels: FBS: 60 - <90 1 hour: <140 2 hour: <120  Patient received the following handouts:  Nutrition Diabetes and Pregnancy  Carbohydrate Counting List  Meal Planning worksheet  Patient will be seen for follow-up as needed.

## 2013-03-25 LAB — OB RESULTS CONSOLE GBS: GBS: NEGATIVE

## 2013-04-26 ENCOUNTER — Encounter (HOSPITAL_COMMUNITY): Payer: Self-pay | Admitting: *Deleted

## 2013-04-26 ENCOUNTER — Inpatient Hospital Stay (HOSPITAL_COMMUNITY)
Admission: AD | Admit: 2013-04-26 | Discharge: 2013-04-26 | Disposition: A | Discharge status: Home / Self Care | Source: Ambulatory Visit | Attending: Obstetrics and Gynecology | Admitting: Obstetrics and Gynecology

## 2013-04-26 DIAGNOSIS — O368190 Decreased fetal movements, unspecified trimester, not applicable or unspecified: Secondary | ICD-10-CM | POA: Insufficient documentation

## 2013-04-26 DIAGNOSIS — O99810 Abnormal glucose complicating pregnancy: Secondary | ICD-10-CM | POA: Insufficient documentation

## 2013-04-26 DIAGNOSIS — O368130 Decreased fetal movements, third trimester, not applicable or unspecified: Secondary | ICD-10-CM

## 2013-04-26 NOTE — MAU Provider Note (Signed)
  History     CSN: [id number]  Arrival date and time: 04/26/13 [id number]   First Provider Initiated Contact with Patient 04/26/13 2012      Chief Complaint  Patient presents with  . Decreased Fetal Movement   HPI  Flossie BuffyGoldie R Dupre is a 33 y.o. G1P0 at 6075w3d who presents today with decreased fetal movement. She states that she called the office, and was told to drink something and count fetal movements. She only felt 2 movements in one house, and then came here for evaluation. She was placed on the monitor on arrival, and has been feeling regular fetal movement since being here. She denies any pain, contractions, LOF or VB. She is a class A1 GDM. Her next appointment in the office is on 05/01/13.   Past Medical History  Diagnosis Date  . Gestational diabetes mellitus, antepartum     Past Surgical History  Procedure Laterality Date  . Breast reduction surgery  07/2010    History reviewed. No pertinent family history.  History  Substance Use Topics  . Smoking status: Never Smoker   . Smokeless tobacco: Not on file  . Alcohol Use: Yes    Allergies: No Known Allergies  Prescriptions prior to admission  Medication Sig Dispense Refill  . ferrous sulfate 325 (65 FE) MG tablet Take 325 mg by mouth every other day.      . Prenatal Vit-Fe Fumarate-FA (PRENATAL MULTIVITAMIN) TABS tablet Take 1 tablet by mouth daily at 12 noon.        ROS Physical Exam   Blood pressure 117/72, pulse 109, temperature 98.1 F (36.7 C), resp. rate 20, height 5\' 9"  (1.753 m), weight 102.513 kg (226 lb), SpO2 100.00%.  Physical Exam  Nursing note and vitals reviewed. Constitutional: She is oriented to person, place, and time. She appears well-developed and well-nourished. No distress.  Cardiovascular: Normal rate.   Respiratory: Effort normal.  GI: Soft. There is no tenderness.  Neurological: She is alert and oriented to person, place, and time.  Skin: Skin is warm and dry.  Psychiatric: She has a  normal mood and affect.    NST 120, moderate variability, 15x15 accels, no decels Toco: no UCs MAU Course  Procedures  2020: D/W Dr. Tenny Crawoss and reviewed Cat I tracing. Ok for [employer] at this time, and plan to keep office appointment. They will call if they want her to come in sooner.   Assessment and Plan   1. Decreased fetal movements in third trimester    Labor precautions Fetal kick counts Return to MAU as needed  Follow-up Information   Follow up with [facility] (As scheduled for 05/01/13. They will call if they want you to come in sooner. )    Contact information:   [address] [place] [id number] [phone]       Tawnya CrookHogan, Heather Donovan 04/26/2013, 8:16 PM

## 2013-04-26 NOTE — Discharge Instructions (Signed)
Fetal Movement Counts Patient Name: __________________________________________________ Patient Due Date: ____________________ Performing a fetal movement count is highly recommended in high-risk pregnancies, but it is good for every pregnant woman to do. Your caregiver may ask you to start counting fetal movements at 28 weeks of the pregnancy. Fetal movements often increase:  After eating a full meal.  After physical activity.  After eating or drinking something sweet or cold.  At rest. Pay attention to when you feel the baby is most active. This will help you notice a pattern of your baby's sleep and wake cycles and what factors contribute to an increase in fetal movement. It is important to perform a fetal movement count at the same time each day when your baby is normally most active.  HOW TO COUNT FETAL MOVEMENTS 1. Find a quiet and comfortable area to sit or lie down on your left side. Lying on your left side provides the best blood and oxygen circulation to your baby. 2. Write down the day and time on a sheet of paper or in a journal. 3. Start counting kicks, flutters, swishes, rolls, or jabs in a 2 hour period. You should feel at least 10 movements within 2 hours. 4. If you do not feel 10 movements in 2 hours, wait 2 3 hours and count again. Look for a change in the pattern or not enough counts in 2 hours. SEEK MEDICAL CARE IF:  You feel less than 10 counts in 2 hours, tried twice.  There is no movement in over an hour.  The pattern is changing or taking longer each day to reach 10 counts in 2 hours.  You feel the baby is not moving as he or she usually does. Date: ____________ Movements: ____________ Start time: ____________ Finish time: ____________  Date: ____________ Movements: ____________ Start time: ____________ Finish time: ____________ Date: ____________ Movements: ____________ Start time: ____________ Finish time: ____________ Date: ____________ Movements: ____________  Start time: ____________ Finish time: ____________ Date: ____________ Movements: ____________ Start time: ____________ Finish time: ____________ Date: ____________ Movements: ____________ Start time: ____________ Finish time: ____________ Date: ____________ Movements: ____________ Start time: ____________ Finish time: ____________ Date: ____________ Movements: ____________ Start time: ____________ Finish time: ____________  Date: ____________ Movements: ____________ Start time: ____________ Finish time: ____________ Date: ____________ Movements: ____________ Start time: ____________ Finish time: ____________ Date: ____________ Movements: ____________ Start time: ____________ Finish time: ____________ Date: ____________ Movements: ____________ Start time: ____________ Finish time: ____________ Date: ____________ Movements: ____________ Start time: ____________ Finish time: ____________ Date: ____________ Movements: ____________ Start time: ____________ Finish time: ____________ Date: ____________ Movements: ____________ Start time: ____________ Finish time: ____________  Date: ____________ Movements: ____________ Start time: ____________ Finish time: ____________ Date: ____________ Movements: ____________ Start time: ____________ Finish time: ____________ Date: ____________ Movements: ____________ Start time: ____________ Finish time: ____________ Date: ____________ Movements: ____________ Start time: ____________ Finish time: ____________ Date: ____________ Movements: ____________ Start time: ____________ Finish time: ____________ Date: ____________ Movements: ____________ Start time: ____________ Finish time: ____________ Date: ____________ Movements: ____________ Start time: ____________ Finish time: ____________  Date: ____________ Movements: ____________ Start time: ____________ Finish time: ____________ Date: ____________ Movements: ____________ Start time: ____________ Finish time:  ____________ Date: ____________ Movements: ____________ Start time: ____________ Finish time: ____________ Date: ____________ Movements: ____________ Start time: ____________ Finish time: ____________ Date: ____________ Movements: ____________ Start time: ____________ Finish time: ____________ Date: ____________ Movements: ____________ Start time: ____________ Finish time: ____________ Date: ____________ Movements: ____________ Start time: ____________ Finish time: ____________  Date: ____________ Movements: ____________ Start time: ____________ Finish   time: ____________ Date: ____________ Movements: ____________ Start time: ____________ Finish time: ____________ Date: ____________ Movements: ____________ Start time: ____________ Finish time: ____________ Date: ____________ Movements: ____________ Start time: ____________ Finish time: ____________ Date: ____________ Movements: ____________ Start time: ____________ Finish time: ____________ Date: ____________ Movements: ____________ Start time: ____________ Finish time: ____________ Date: ____________ Movements: ____________ Start time: ____________ Finish time: ____________  Date: ____________ Movements: ____________ Start time: ____________ Finish time: ____________ Date: ____________ Movements: ____________ Start time: ____________ Finish time: ____________ Date: ____________ Movements: ____________ Start time: ____________ Finish time: ____________ Date: ____________ Movements: ____________ Start time: ____________ Finish time: ____________ Date: ____________ Movements: ____________ Start time: ____________ Finish time: ____________ Date: ____________ Movements: ____________ Start time: ____________ Finish time: ____________ Date: ____________ Movements: ____________ Start time: ____________ Finish time: ____________  Date: ____________ Movements: ____________ Start time: ____________ Finish time: ____________ Date: ____________ Movements:  ____________ Start time: ____________ Finish time: ____________ Date: ____________ Movements: ____________ Start time: ____________ Finish time: ____________ Date: ____________ Movements: ____________ Start time: ____________ Finish time: ____________ Date: ____________ Movements: ____________ Start time: ____________ Finish time: ____________ Date: ____________ Movements: ____________ Start time: ____________ Finish time: ____________ Date: ____________ Movements: ____________ Start time: ____________ Finish time: ____________  Date: ____________ Movements: ____________ Start time: ____________ Finish time: ____________ Date: ____________ Movements: ____________ Start time: ____________ Finish time: ____________ Date: ____________ Movements: ____________ Start time: ____________ Finish time: ____________ Date: ____________ Movements: ____________ Start time: ____________ Finish time: ____________ Date: ____________ Movements: ____________ Start time: ____________ Finish time: ____________ Date: ____________ Movements: ____________ Start time: ____________ Finish time: ____________ Document Released: 02/21/2006 Document Revised: 01/09/2012 Document Reviewed: 11/19/2011 Exit[employer] Patient Information 2014 [employer].  

## 2013-04-26 NOTE — MAU Note (Signed)
Decreased fetal movement. Called her doctor on call. Was told to drink something and count fetal kick counts. Only felt two movements in 1 hour. Last felt movement 45 minutes ago.

## 2013-05-01 ENCOUNTER — Encounter (HOSPITAL_COMMUNITY): Admitting: Anesthesiology

## 2013-05-01 ENCOUNTER — Inpatient Hospital Stay (HOSPITAL_COMMUNITY): Admitting: Anesthesiology

## 2013-05-01 ENCOUNTER — Encounter (HOSPITAL_COMMUNITY): Payer: Self-pay | Admitting: *Deleted

## 2013-05-01 ENCOUNTER — Inpatient Hospital Stay (HOSPITAL_COMMUNITY)
Admission: AD | Admit: 2013-05-01 | Discharge: 2013-05-05 | DRG: 766 | Disposition: A | Discharge status: Home / Self Care | Source: Ambulatory Visit | Attending: Obstetrics & Gynecology | Admitting: Obstetrics & Gynecology

## 2013-05-01 DIAGNOSIS — O480 Post-term pregnancy: Secondary | ICD-10-CM | POA: Diagnosis present

## 2013-05-01 DIAGNOSIS — Z87891 Personal history of nicotine dependence: Secondary | ICD-10-CM

## 2013-05-01 DIAGNOSIS — D649 Anemia, unspecified: Secondary | ICD-10-CM | POA: Diagnosis not present

## 2013-05-01 DIAGNOSIS — O3660X0 Maternal care for excessive fetal growth, unspecified trimester, not applicable or unspecified: Secondary | ICD-10-CM | POA: Diagnosis present

## 2013-05-01 DIAGNOSIS — O411290 Chorioamnionitis, unspecified trimester, not applicable or unspecified: Secondary | ICD-10-CM

## 2013-05-01 DIAGNOSIS — O9903 Anemia complicating the puerperium: Secondary | ICD-10-CM | POA: Diagnosis not present

## 2013-05-01 DIAGNOSIS — O324XX0 Maternal care for high head at term, not applicable or unspecified: Secondary | ICD-10-CM | POA: Diagnosis present

## 2013-05-01 DIAGNOSIS — O99814 Abnormal glucose complicating childbirth: Secondary | ICD-10-CM | POA: Diagnosis present

## 2013-05-01 LAB — CBC
HEMATOCRIT: 31.3 % — AB (ref 36.0–46.0)
HEMOGLOBIN: 10.4 g/dL — AB (ref 12.0–15.0)
MCH: 24.8 pg — ABNORMAL LOW (ref 26.0–34.0)
MCHC: 33.2 g/dL (ref 30.0–36.0)
MCV: 74.5 fL — ABNORMAL LOW (ref 78.0–100.0)
Platelets: 196 10*3/uL (ref 150–400)
RBC: 4.2 MIL/uL (ref 3.87–5.11)
RDW: 15.4 % (ref 11.5–15.5)
WBC: 7.8 10*3/uL (ref 4.0–10.5)

## 2013-05-01 MED ORDER — ACETAMINOPHEN 325 MG PO TABS
650.0000 mg | ORAL_TABLET | ORAL | Status: DC | PRN
  Administered 2013-05-02: 650 mg via ORAL
  Administered 2013-05-02: 325 mg via ORAL
  Filled 2013-05-01 (×2): qty 2

## 2013-05-01 MED ORDER — LACTATED RINGERS IV SOLN
500.0000 mL | Freq: Once | INTRAVENOUS | Status: AC
  Administered 2013-05-01: 500 mL via INTRAVENOUS

## 2013-05-01 MED ORDER — LIDOCAINE HCL (PF) 1 % IJ SOLN: 30.0000 mL | INTRAMUSCULAR | Status: DC | PRN

## 2013-05-01 MED ORDER — DIPHENHYDRAMINE HCL 50 MG/ML IJ SOLN
12.5000 mg | INTRAMUSCULAR | Status: DC | PRN
  Administered 2013-05-02: 12.5 mg via INTRAVENOUS
  Filled 2013-05-01: qty 1

## 2013-05-01 MED ORDER — PHENYLEPHRINE 40 MCG/ML (10ML) SYRINGE FOR IV PUSH (FOR BLOOD PRESSURE SUPPORT)
80.0000 ug | PREFILLED_SYRINGE | INTRAVENOUS | Status: DC | PRN
  Filled 2013-05-01: qty 10

## 2013-05-01 MED ORDER — CITRIC ACID-SODIUM CITRATE 334-500 MG/5ML PO SOLN
30.0000 mL | ORAL | Status: DC | PRN
Start: 2013-05-01 — End: 2013-05-02
  Administered 2013-05-02: 30 mL via ORAL
  Filled 2013-05-01: qty 15

## 2013-05-01 MED ORDER — EPHEDRINE 5 MG/ML INJ
10.0000 mg | INTRAVENOUS | Status: DC | PRN
  Filled 2013-05-01: qty 4

## 2013-05-01 MED ORDER — IBUPROFEN 600 MG PO TABS: 600.0000 mg | ORAL_TABLET | Freq: Four times a day (QID) | ORAL | Status: DC | PRN

## 2013-05-01 MED ORDER — OXYTOCIN 40 UNITS IN LACTATED RINGERS INFUSION - SIMPLE MED: 62.5000 mL/h | INTRAVENOUS | Status: DC

## 2013-05-01 MED ORDER — OXYCODONE-ACETAMINOPHEN 5-325 MG PO TABS: 1.0000 | ORAL_TABLET | ORAL | Status: DC | PRN

## 2013-05-01 MED ORDER — EPHEDRINE 5 MG/ML INJ: 10.0000 mg | INTRAVENOUS | Status: DC | PRN

## 2013-05-01 MED ORDER — LACTATED RINGERS IV SOLN
INTRAVENOUS | Status: DC
  Administered 2013-05-01 (×2): via INTRAVENOUS

## 2013-05-01 MED ORDER — LACTATED RINGERS IV SOLN
500.0000 mL | INTRAVENOUS | Status: DC | PRN
  Administered 2013-05-02: 300 mL via INTRAVENOUS

## 2013-05-01 MED ORDER — FENTANYL CITRATE 0.05 MG/ML IJ SOLN
100.0000 ug | Freq: Once | INTRAMUSCULAR | Status: AC
  Administered 2013-05-01: 100 ug via INTRAVENOUS
  Filled 2013-05-01: qty 2

## 2013-05-01 MED ORDER — OXYTOCIN 40 UNITS IN LACTATED RINGERS INFUSION - SIMPLE MED
1.0000 m[IU]/min | INTRAVENOUS | Status: DC
  Administered 2013-05-01: 2 m[IU]/min via INTRAVENOUS
  Administered 2013-05-02: 4 m[IU]/min via INTRAVENOUS
  Filled 2013-05-01: qty 1000

## 2013-05-01 MED ORDER — LIDOCAINE HCL (PF) 1 % IJ SOLN
INTRAMUSCULAR | Status: DC | PRN
  Administered 2013-05-01 (×2): 5 mL

## 2013-05-01 MED ORDER — FENTANYL 2.5 MCG/ML BUPIVACAINE 1/10 % EPIDURAL INFUSION (WH - ANES)
14.0000 mL/h | INTRAMUSCULAR | Status: DC | PRN
  Administered 2013-05-01 – 2013-05-02 (×3): 14 mL/h via EPIDURAL
  Filled 2013-05-01 (×3): qty 125

## 2013-05-01 MED ORDER — PHENYLEPHRINE 40 MCG/ML (10ML) SYRINGE FOR IV PUSH (FOR BLOOD PRESSURE SUPPORT)
80.0000 ug | PREFILLED_SYRINGE | INTRAVENOUS | Status: DC | PRN
  Administered 2013-05-02: 80 ug via INTRAVENOUS

## 2013-05-01 MED ORDER — OXYTOCIN BOLUS FROM INFUSION: 500.0000 mL | INTRAVENOUS | Status: DC

## 2013-05-01 MED ORDER — TERBUTALINE SULFATE 1 MG/ML IJ SOLN: 0.2500 mg | Freq: Once | INTRAMUSCULAR | Status: AC | PRN

## 2013-05-01 MED ORDER — ONDANSETRON HCL 4 MG/2ML IJ SOLN: 4.0000 mg | Freq: Four times a day (QID) | INTRAMUSCULAR | Status: DC | PRN

## 2013-05-01 NOTE — H&P (Signed)
Marissa Jacobson is a 33 y.o. female presenting for induction of labor. Patient is 40 weeks one day by LMP confirmed by early ultrasound  With a prolonged deceleration in office. She is having irregular painful contractions, no leaking of fluid, no vaginal bleeding, notes good  Fetal movements.   Maternal Medical History:  Reason for admission: Nausea.  Prenatal complications: A1GDM    OB History   Grav Para Term Preterm Abortions TAB SAB Ect Mult Living   1              Past Medical History  Diagnosis Date  . Gestational diabetes mellitus, antepartum    Past Surgical History  Procedure Laterality Date  . Breast reduction surgery  07/2010   Family History: family history includes Cancer in her paternal grandfather; Miscarriages / [place] in [place]. Social History:  reports that she has quit smoking. She has never used smokeless tobacco. She reports that she drinks alcohol. She reports that she does not use illicit drugs.   Prenatal Transfer Tool  Maternal Diabetes: Yes:  Diabetes Type:  Diet controlled Genetic Screening: Normal Maternal Ultrasounds/Referrals: Normal Fetal Ultrasounds or other Referrals:  Referred to Materal Fetal Medicine  Growth scan LGA Maternal Substance Abuse:  No Significant Maternal Medications:  None Significant Maternal Lab Results:  None Other Comments:  None  Review of Systems  Constitutional: Negative.   Eyes: Negative.   Respiratory: Negative.   Cardiovascular: Negative.   Gastrointestinal: Negative for nausea, vomiting, abdominal pain and diarrhea.  Genitourinary: Negative.   Neurological: Negative.       Blood pressure 100/62, pulse 82, temperature 98.2 F (36.8 C), temperature source Oral, resp. rate 20, height 5\' 9"  (1.753 m), weight 102.513 kg (226 lb). Maternal Exam:  Uterine Assessment: Contraction strength is moderate.  Contraction duration is 1 minute. Contraction frequency is irregular.   Abdomen: Patient reports no  abdominal tenderness. Fundal height is 40.   Estimated fetal weight is 3900.   Fetal presentation: vertex  Introitus: Normal vulva. Normal vagina.    Physical Exam  Constitutional: She is oriented to person, place, and time. She appears well-developed and well-nourished.  HENT:  Head: Normocephalic.  Eyes: Pupils are equal, round, and reactive to light.  Neck: Normal range of motion.  Cardiovascular: Normal rate and regular rhythm.   Respiratory: Effort normal and breath sounds normal.  Musculoskeletal: Normal range of motion.  Neurological: She is alert and oriented to person, place, and time. She has normal reflexes.    Cervix in office 3/50/-2  Prenatal labs: ABO, Rh: B/Positive/-- (10/22 0000) Antibody: Negative (10/22 0000) Rubella: Immune (10/22 0000) RPR: Nonreactive (12/30 0000)  HBsAg: Negative (10/22 0000)  HIV: Non-reactive (10/22 0000)  GBS: Negative (02/18 0000)   Assessment/Plan: Primagravida with A1 GDM at term with prolonged decel in office for IOL.  Tracing Cat I while here in hospital Admit to L&D Continuous monitoring IV fluids Pitocin for induction Close monitoring   Marissa Jacobson Marissa Jacobson 05/01/2013, 1:44 PM

## 2013-05-01 NOTE — Anesthesia Preprocedure Evaluation (Signed)
Anesthesia Evaluation  Patient identified by MRN, date of birth, ID band Patient awake    Reviewed: Allergy & Precautions, H&P , Patient's Chart, lab work & pertinent test results  Airway Mallampati: II TM Distance: >3 FB Neck ROM: full    Dental   Pulmonary former smoker,  breath sounds clear to auscultation        Cardiovascular Rhythm:regular Rate:Normal     Neuro/Psych    GI/Hepatic   Endo/Other  diabetes, Well Controlled, Gestational  Renal/GU      Musculoskeletal   Abdominal   Peds  Hematology   Anesthesia Other Findings   Reproductive/Obstetrics (+) Pregnancy                           Anesthesia Physical Anesthesia Plan  ASA: III  Anesthesia Plan: Epidural   Post-op Pain Management:    Induction:   Airway Management Planned:   Additional Equipment:   Intra-op Plan:   Post-operative Plan:   Informed Consent: I have reviewed the patients History and Physical, chart, labs and discussed the procedure including the risks, benefits and alternatives for the proposed anesthesia with the patient or authorized representative who has indicated his/her understanding and acceptance.     Plan Discussed with:   Anesthesia Plan Comments:         Anesthesia Quick Evaluation

## 2013-05-01 NOTE — Progress Notes (Signed)
Flossie BuffyGoldie R Verbeek is a 33 y.o. G1P0 at 219w1d by LMP admitted for induction of labor due to Non-reactive NST.  Subjective: Patient starting to feel uncomfortable  Objective: BP 123/68  Pulse 78  Temp(Src) 98.2 F (36.8 C) (Oral)  Resp 20  Ht 5\' 9"  (1.753 m)  Wt 102.513 kg (226 lb)  BMI 33.36 kg/m2      FHT:  FHR: 140 bpm, variability: moderate,  accelerations:  Present,  decelerations:  Absent UC:   regular, every 3 minutes SVE:   Dilation: 3 Effacement (%): 50 Station: -2 Exam by:: Dr. Mora ApplPinn  Labs: Lab Results  Component Value Date   WBC 7.8 05/01/2013   HGB 10.4* 05/01/2013   HCT 31.3* 05/01/2013   MCV 74.5* 05/01/2013   PLT 196 05/01/2013    Assessment / Plan: Induction of labor due to non-reassuring fetal testing,  progressing well on pitocin  Labor: Progressing on Pitocin, will continue to increase then AROM Preeclampsia:  no signs or symptoms of toxicity Fetal Wellbeing:  Category I Pain Control:  Fentanyl I/D:  n/a Anticipated MOD:  NSVD  Clee Pandit [name] 05/01/2013, 8:14 PM

## 2013-05-01 NOTE — Anesthesia Procedure Notes (Signed)
Epidural Patient location during procedure: OB Start time: 05/01/2013 11:11 PM  Staffing Anesthesiologist: Brayton CavesJACKSON, Giorgi Debruin Performed by: anesthesiologist   Preanesthetic Checklist Completed: patient identified, site marked, surgical consent, pre-op evaluation, timeout performed, IV checked, risks and benefits discussed and monitors and equipment checked  Epidural Patient position: sitting Prep: site prepped and draped and DuraPrep Patient monitoring: continuous pulse ox and blood pressure Approach: midline Injection technique: LOR air  Needle:  Needle type: Tuohy  Needle gauge: 17 G Needle length: 9 cm and 9 Needle insertion depth: 7 cm Catheter type: closed end flexible Catheter size: 19 Gauge Catheter at skin depth: 12 cm Test dose: negative  Assessment Events: blood not aspirated, injection not painful, no injection resistance, negative IV test and no paresthesia  Additional Notes Patient identified.  Risk benefits discussed including failed block, incomplete pain control, headache, nerve damage, paralysis, blood pressure changes, nausea, vomiting, reactions to medication both toxic or allergic, and postpartum back pain.  Patient expressed understanding and wished to proceed.  All questions were answered.  Sterile technique used throughout procedure and epidural site dressed with sterile barrier dressing. No paresthesia or other complications noted.The patient did not experience any signs of intravascular injection such as tinnitus or metallic taste in mouth nor signs of intrathecal spread such as rapid motor block. Please see nursing notes for vital signs.

## 2013-05-02 ENCOUNTER — Encounter (HOSPITAL_COMMUNITY)
Admission: AD | Disposition: A | Discharge status: Home / Self Care | Payer: Self-pay | Source: Ambulatory Visit | Attending: Obstetrics & Gynecology

## 2013-05-02 ENCOUNTER — Encounter (HOSPITAL_COMMUNITY): Payer: Self-pay | Admitting: *Deleted

## 2013-05-02 DIAGNOSIS — D649 Anemia, unspecified: Secondary | ICD-10-CM

## 2013-05-02 DIAGNOSIS — O99814 Abnormal glucose complicating childbirth: Secondary | ICD-10-CM

## 2013-05-02 LAB — GLUCOSE, CAPILLARY
GLUCOSE-CAPILLARY: 76 mg/dL (ref 70–99)
GLUCOSE-CAPILLARY: 104 mg/dL — AB (ref 70–99)
Glucose-Capillary: 81 mg/dL (ref 70–99)

## 2013-05-02 LAB — RPR: RPR Ser Ql: NONREACTIVE

## 2013-05-02 SURGERY — Surgical Case
Anesthesia: Epidural | Site: Abdomen

## 2013-05-02 MED ORDER — DIPHENHYDRAMINE HCL 25 MG PO CAPS
25.0000 mg | ORAL_CAPSULE | ORAL | Status: DC | PRN
  Administered 2013-05-03 (×2): 25 mg via ORAL
  Filled 2013-05-02 (×2): qty 1

## 2013-05-02 MED ORDER — DIPHENHYDRAMINE HCL 50 MG/ML IJ SOLN
12.5000 mg | INTRAMUSCULAR | Status: DC | PRN
  Administered 2013-05-03: 50 mg via INTRAVENOUS
  Filled 2013-05-02: qty 1

## 2013-05-02 MED ORDER — ONDANSETRON HCL 4 MG/2ML IJ SOLN: 4.0000 mg | INTRAMUSCULAR | Status: DC | PRN

## 2013-05-02 MED ORDER — ONDANSETRON HCL 4 MG/2ML IJ SOLN: 4.0000 mg | Freq: Three times a day (TID) | INTRAMUSCULAR | Status: DC | PRN

## 2013-05-02 MED ORDER — PHENYLEPHRINE 8 MG IN D5W 100 ML (0.08MG/ML) PREMIX OPTIME
INJECTION | INTRAVENOUS | Status: AC
  Filled 2013-05-02: qty 100

## 2013-05-02 MED ORDER — TETANUS-DIPHTH-ACELL PERTUSSIS 5-2.5-18.5 LF-MCG/0.5 IM SUSP: 0.5000 mL | Freq: Once | INTRAMUSCULAR | Status: DC

## 2013-05-02 MED ORDER — MEPERIDINE HCL 25 MG/ML IJ SOLN: 6.2500 mg | INTRAMUSCULAR | Status: DC | PRN

## 2013-05-02 MED ORDER — SODIUM BICARBONATE 8.4 % IV SOLN
INTRAVENOUS | Status: DC | PRN
  Administered 2013-05-02: 5 mL via EPIDURAL
  Administered 2013-05-02: 10 mL via EPIDURAL
  Administered 2013-05-02: 5 mL via EPIDURAL

## 2013-05-02 MED ORDER — MORPHINE SULFATE (PF) 0.5 MG/ML IJ SOLN
INTRAMUSCULAR | Status: DC | PRN
  Administered 2013-05-02: 4 mg via EPIDURAL

## 2013-05-02 MED ORDER — SODIUM CHLORIDE 0.9 % IJ SOLN
3.0000 mL | INTRAMUSCULAR | Status: DC | PRN
Start: 2013-05-02 — End: 2013-05-06

## 2013-05-02 MED ORDER — OXYTOCIN 10 UNIT/ML IJ SOLN
INTRAMUSCULAR | Status: AC
  Filled 2013-05-02: qty 4

## 2013-05-02 MED ORDER — SENNOSIDES-DOCUSATE SODIUM 8.6-50 MG PO TABS
2.0000 | ORAL_TABLET | ORAL | Status: DC
  Administered 2013-05-03: 2 via ORAL
  Filled 2013-05-02: qty 2

## 2013-05-02 MED ORDER — DIPHENHYDRAMINE HCL 25 MG PO CAPS: 25.0000 mg | ORAL_CAPSULE | Freq: Four times a day (QID) | ORAL | Status: DC | PRN

## 2013-05-02 MED ORDER — 0.9 % SODIUM CHLORIDE (POUR BTL) OPTIME
TOPICAL | Status: DC | PRN
  Administered 2013-05-02 (×2): 1000 mL

## 2013-05-02 MED ORDER — IBUPROFEN 600 MG PO TABS
600.0000 mg | ORAL_TABLET | Freq: Four times a day (QID) | ORAL | Status: DC
  Administered 2013-05-03 – 2013-05-05 (×10): 600 mg via ORAL
  Filled 2013-05-02 (×10): qty 1

## 2013-05-02 MED ORDER — KETOROLAC TROMETHAMINE 30 MG/ML IJ SOLN
30.0000 mg | Freq: Four times a day (QID) | INTRAMUSCULAR | Status: AC | PRN
  Administered 2013-05-02: 30 mg via INTRAVENOUS

## 2013-05-02 MED ORDER — OXYCODONE-ACETAMINOPHEN 5-325 MG PO TABS
1.0000 | ORAL_TABLET | ORAL | Status: DC | PRN
  Administered 2013-05-03: 1 via ORAL
  Administered 2013-05-04 (×2): 2 via ORAL
  Administered 2013-05-04 – 2013-05-05 (×3): 1 via ORAL
  Filled 2013-05-02: qty 2
  Filled 2013-05-02 (×2): qty 1
  Filled 2013-05-02: qty 2
  Filled 2013-05-02: qty 1
  Filled 2013-05-02: qty 2
  Filled 2013-05-02: qty 1

## 2013-05-02 MED ORDER — NALBUPHINE HCL 10 MG/ML IJ SOLN
5.0000 mg | INTRAMUSCULAR | Status: DC | PRN
  Administered 2013-05-03: 10 mg via INTRAVENOUS
  Administered 2013-05-03: 5 mg via INTRAVENOUS
  Filled 2013-05-02: qty 1

## 2013-05-02 MED ORDER — KETOROLAC TROMETHAMINE 30 MG/ML IJ SOLN
INTRAMUSCULAR | Status: AC
  Administered 2013-05-02: 30 mg via INTRAVENOUS
  Filled 2013-05-02: qty 1

## 2013-05-02 MED ORDER — FENTANYL CITRATE 0.05 MG/ML IJ SOLN
25.0000 ug | INTRAMUSCULAR | Status: DC | PRN
  Administered 2013-05-02: 25 ug via INTRAVENOUS

## 2013-05-02 MED ORDER — DIBUCAINE 1 % RE OINT: 1.0000 "application " | TOPICAL_OINTMENT | RECTAL | Status: DC | PRN

## 2013-05-02 MED ORDER — SCOPOLAMINE 1 MG/3DAYS TD PT72
MEDICATED_PATCH | TRANSDERMAL | Status: AC
Start: 2013-05-02 — End: 2013-05-03
  Filled 2013-05-02: qty 1

## 2013-05-02 MED ORDER — SODIUM CHLORIDE 0.9 % IV SOLN
3.0000 g | Freq: Four times a day (QID) | INTRAVENOUS | Status: DC
  Administered 2013-05-02 – 2013-05-03 (×4): 3 g via INTRAVENOUS
  Filled 2013-05-02 (×6): qty 3

## 2013-05-02 MED ORDER — MENTHOL 3 MG MT LOZG: 1.0000 | LOZENGE | OROMUCOSAL | Status: DC | PRN

## 2013-05-02 MED ORDER — PROMETHAZINE HCL 25 MG/ML IJ SOLN: 6.2500 mg | INTRAMUSCULAR | Status: DC | PRN

## 2013-05-02 MED ORDER — SODIUM BICARBONATE 8.4 % IV SOLN
INTRAVENOUS | Status: AC
  Filled 2013-05-02: qty 50

## 2013-05-02 MED ORDER — MIDAZOLAM HCL 2 MG/2ML IJ SOLN: 0.5000 mg | Freq: Once | INTRAMUSCULAR | Status: DC | PRN

## 2013-05-02 MED ORDER — OXYTOCIN 10 UNIT/ML IJ SOLN
40.0000 [IU] | INTRAVENOUS | Status: DC | PRN
  Administered 2013-05-02: 40 [IU] via INTRAVENOUS

## 2013-05-02 MED ORDER — LIDOCAINE-EPINEPHRINE (PF) 2 %-1:200000 IJ SOLN
INTRAMUSCULAR | Status: AC
  Filled 2013-05-02: qty 20

## 2013-05-02 MED ORDER — PHENYLEPHRINE 8 MG IN D5W 100 ML (0.08MG/ML) PREMIX OPTIME
INJECTION | INTRAVENOUS | Status: DC | PRN
  Administered 2013-05-01: 30 mL via INTRAVENOUS

## 2013-05-02 MED ORDER — NALOXONE HCL 1 MG/ML IJ SOLN
1.0000 ug/kg/h | INTRAVENOUS | Status: DC | PRN
  Filled 2013-05-02: qty 2

## 2013-05-02 MED ORDER — PHENYLEPHRINE 8 MG IN D5W 100 ML (0.08MG/ML) PREMIX OPTIME
INJECTION | INTRAVENOUS | Status: DC | PRN
Start: 2013-05-02 — End: 2013-05-02
  Administered 2013-05-02: 60 ug/min via INTRAVENOUS

## 2013-05-02 MED ORDER — ACETAMINOPHEN 500 MG PO TABS
1000.0000 mg | ORAL_TABLET | Freq: Four times a day (QID) | ORAL | Status: DC
  Administered 2013-05-03 (×2): 1000 mg via ORAL
  Filled 2013-05-02 (×2): qty 2

## 2013-05-02 MED ORDER — ZOLPIDEM TARTRATE 5 MG PO TABS: 5.0000 mg | ORAL_TABLET | Freq: Every evening | ORAL | Status: DC | PRN

## 2013-05-02 MED ORDER — DIPHENHYDRAMINE HCL 50 MG/ML IJ SOLN: 25.0000 mg | INTRAMUSCULAR | Status: DC | PRN

## 2013-05-02 MED ORDER — SIMETHICONE 80 MG PO CHEW
80.0000 mg | CHEWABLE_TABLET | ORAL | Status: DC
  Administered 2013-05-03: 80 mg via ORAL
  Filled 2013-05-02: qty 1

## 2013-05-02 MED ORDER — LANOLIN HYDROUS EX OINT: 1.0000 "application " | TOPICAL_OINTMENT | CUTANEOUS | Status: DC | PRN

## 2013-05-02 MED ORDER — LACTATED RINGERS IV SOLN
INTRAVENOUS | Status: DC
  Administered 2013-05-03: 06:00:00 via INTRAVENOUS

## 2013-05-02 MED ORDER — SIMETHICONE 80 MG PO CHEW: 80.0000 mg | CHEWABLE_TABLET | ORAL | Status: DC | PRN

## 2013-05-02 MED ORDER — PRENATAL MULTIVITAMIN CH
1.0000 | ORAL_TABLET | Freq: Every day | ORAL | Status: DC
  Administered 2013-05-03 – 2013-05-05 (×3): 1 via ORAL
  Filled 2013-05-02 (×3): qty 1

## 2013-05-02 MED ORDER — MORPHINE SULFATE 0.5 MG/ML IJ SOLN
INTRAMUSCULAR | Status: AC
  Filled 2013-05-02: qty 10

## 2013-05-02 MED ORDER — MEPERIDINE HCL 25 MG/ML IJ SOLN
INTRAMUSCULAR | Status: AC
  Filled 2013-05-02: qty 1

## 2013-05-02 MED ORDER — MEPERIDINE HCL 25 MG/ML IJ SOLN
INTRAMUSCULAR | Status: DC | PRN
  Administered 2013-05-02 (×2): 12.5 mg via INTRAVENOUS

## 2013-05-02 MED ORDER — WITCH HAZEL-GLYCERIN EX PADS: 1.0000 "application " | MEDICATED_PAD | CUTANEOUS | Status: DC | PRN

## 2013-05-02 MED ORDER — SIMETHICONE 80 MG PO CHEW
80.0000 mg | CHEWABLE_TABLET | Freq: Three times a day (TID) | ORAL | Status: DC
Start: 2013-05-03 — End: 2013-05-06
  Administered 2013-05-03 – 2013-05-05 (×6): 80 mg via ORAL
  Filled 2013-05-02 (×6): qty 1

## 2013-05-02 MED ORDER — ONDANSETRON HCL 4 MG/2ML IJ SOLN
INTRAMUSCULAR | Status: AC
  Filled 2013-05-02: qty 2

## 2013-05-02 MED ORDER — FENTANYL CITRATE 0.05 MG/ML IJ SOLN
INTRAMUSCULAR | Status: AC
  Filled 2013-05-02: qty 2

## 2013-05-02 MED ORDER — NALOXONE HCL 0.4 MG/ML IJ SOLN: 0.4000 mg | INTRAMUSCULAR | Status: DC | PRN

## 2013-05-02 MED ORDER — OXYTOCIN 40 UNITS IN LACTATED RINGERS INFUSION - SIMPLE MED
62.5000 mL/h | INTRAVENOUS | Status: AC
Start: 2013-05-02 — End: 2013-05-03

## 2013-05-02 MED ORDER — SCOPOLAMINE 1 MG/3DAYS TD PT72
1.0000 | MEDICATED_PATCH | Freq: Once | TRANSDERMAL | Status: AC
  Administered 2013-05-02: 1.5 mg via TRANSDERMAL

## 2013-05-02 MED ORDER — ONDANSETRON HCL 4 MG PO TABS: 4.0000 mg | ORAL_TABLET | ORAL | Status: DC | PRN

## 2013-05-02 MED ORDER — METOCLOPRAMIDE HCL 5 MG/ML IJ SOLN
INTRAMUSCULAR | Status: AC
  Filled 2013-05-02: qty 2

## 2013-05-02 MED ORDER — METOCLOPRAMIDE HCL 5 MG/ML IJ SOLN: 10.0000 mg | Freq: Three times a day (TID) | INTRAMUSCULAR | Status: DC | PRN

## 2013-05-02 MED ORDER — NALBUPHINE HCL 10 MG/ML IJ SOLN
5.0000 mg | INTRAMUSCULAR | Status: DC | PRN
  Filled 2013-05-02: qty 1

## 2013-05-02 MED ORDER — ONDANSETRON HCL 4 MG/2ML IJ SOLN
INTRAMUSCULAR | Status: DC | PRN
  Administered 2013-05-02: 4 mg via INTRAVENOUS

## 2013-05-02 MED ORDER — PHENYLEPHRINE 8 MG IN D5W 100 ML (0.08MG/ML) PREMIX OPTIME
INJECTION | INTRAVENOUS | Status: DC | PRN
  Administered 2013-05-01: 60 ug/min via INTRAVENOUS

## 2013-05-02 MED ORDER — KETOROLAC TROMETHAMINE 30 MG/ML IJ SOLN: 30.0000 mg | Freq: Four times a day (QID) | INTRAMUSCULAR | Status: AC | PRN

## 2013-05-02 MED ORDER — IBUPROFEN 600 MG PO TABS: 600.0000 mg | ORAL_TABLET | Freq: Four times a day (QID) | ORAL | Status: DC | PRN

## 2013-05-02 SURGICAL SUPPLY — 44 items
APL SKNCLS STERI-STRIP NONHPOA (GAUZE/BANDAGES/DRESSINGS) ×1
BENZOIN TINCTURE PRP APPL 2/3 (GAUZE/BANDAGES/DRESSINGS) ×3 IMPLANT
CLAMP CORD UMBIL (MISCELLANEOUS) IMPLANT
CLOSURE WOUND 1/2 X4 (GAUZE/BANDAGES/DRESSINGS) ×1
CLOTH BEACON ORANGE TIMEOUT ST (SAFETY) ×3 IMPLANT
DRAPE LG THREE QUARTER DISP (DRAPES) IMPLANT
DRSG OPSITE POSTOP 4X10 (GAUZE/BANDAGES/DRESSINGS) ×3 IMPLANT
DURAPREP 26ML APPLICATOR (WOUND CARE) ×3 IMPLANT
ELECT REM PT RETURN 9FT ADLT (ELECTROSURGICAL) ×3
ELECTRODE REM PT RTRN 9FT ADLT (ELECTROSURGICAL) ×1 IMPLANT
EXTRACTOR VACUUM BELL STYLE (SUCTIONS) IMPLANT
GAUZE SPONGE 4X4 12PLY STRL LF (GAUZE/BANDAGES/DRESSINGS) ×2 IMPLANT
GLOVE BIO SURGEON STRL SZ 6.5 (GLOVE) ×2 IMPLANT
GLOVE BIO SURGEONS STRL SZ 6.5 (GLOVE) ×1
GLOVE BIOGEL PI IND STRL 7.0 (GLOVE) ×1 IMPLANT
GLOVE BIOGEL PI INDICATOR 7.0 (GLOVE) ×2
GOWN STRL REUS W/TWL LRG LVL3 (GOWN DISPOSABLE) ×9 IMPLANT
KIT ABG SYR 3ML LUER SLIP (SYRINGE) IMPLANT
NDL HYPO 25X5/8 SAFETYGLIDE (NEEDLE) IMPLANT
NEEDLE HYPO 25X5/8 SAFETYGLIDE (NEEDLE) IMPLANT
NS IRRIG 1000ML POUR BTL (IV SOLUTION) ×3 IMPLANT
PACK C SECTION WH (CUSTOM PROCEDURE TRAY) ×3 IMPLANT
PAD ABD 7.5X8 STRL (GAUZE/BANDAGES/DRESSINGS) ×2 IMPLANT
PAD OB MATERNITY 4.3X12.25 (PERSONAL CARE ITEMS) ×3 IMPLANT
RETRACTOR WND ALEXIS 25 LRG (MISCELLANEOUS) ×1 IMPLANT
RTRCTR WOUND ALEXIS 25CM LRG (MISCELLANEOUS) ×6
SPONGE LAP 18X18 X RAY DECT (DISPOSABLE) ×6 IMPLANT
STAPLER VISISTAT 35W (STAPLE) IMPLANT
STRIP CLOSURE SKIN 1/2X4 (GAUZE/BANDAGES/DRESSINGS) ×2 IMPLANT
SUT MNCRL 0 VIOLET CTX 36 (SUTURE) ×2 IMPLANT
SUT MONOCRYL 0 CTX 36 (SUTURE) ×6
SUT PDS AB 0 CTX 36 PDP370T (SUTURE) IMPLANT
SUT PLAIN 2 0 (SUTURE) ×3
SUT PLAIN 2 0 XLH (SUTURE) ×2 IMPLANT
SUT PLAIN ABS 2-0 CT1 27XMFL (SUTURE) IMPLANT
SUT VIC AB 0 CT1 27 (SUTURE) ×6
SUT VIC AB 0 CT1 27XBRD ANBCTR (SUTURE) IMPLANT
SUT VIC AB 2-0 CT1 27 (SUTURE) ×6
SUT VIC AB 2-0 CT1 TAPERPNT 27 (SUTURE) IMPLANT
SUT VIC AB 4-0 KS 27 (SUTURE) ×3 IMPLANT
TAPE CLOTH SURG 4X10 WHT LF (GAUZE/BANDAGES/DRESSINGS) ×2 IMPLANT
TOWEL OR 17X24 6PK STRL BLUE (TOWEL DISPOSABLE) ×3 IMPLANT
TRAY FOLEY CATH 14FR (SET/KITS/TRAYS/PACK) ×3 IMPLANT
WATER STERILE IRR 1000ML POUR (IV SOLUTION) ×3 IMPLANT

## 2013-05-02 NOTE — Progress Notes (Signed)
Marissa Jacobson is a 33 y.o. G1P0 at 5359w2d by LMP admitted for induction of labor due to Non-reactive NST and Gestational diabetes.  Subjective: Patient feels pressure but comfortable  Objective: BP 130/63  Pulse 75  Temp(Src) 101.9 F (38.8 C) (Axillary)  Resp 20  Ht 5\' 9"  (1.753 m)  Wt 102.513 kg (226 lb)  BMI 33.36 kg/m2  SpO2 98% I/O last 3 completed shifts: In: -  Out: 1000 [Urine:1000]    FHT:  FHR: 160 bpm, variability: minimal ,  accelerations:  Abscent,  decelerations:  Present Cat III UC:   regular, every 4 minutes SVE:   Dilation: 10 Effacement (%): 100 Station: 0;+1 Exam by:: Dr Mora ApplPinn  Labs: Lab Results  Component Value Date   WBC 7.8 05/01/2013   HGB 10.4* 05/01/2013   HCT 31.3* 05/01/2013   MCV 74.5* 05/01/2013   PLT 196 05/01/2013    Assessment / Plan: Arrest of decent Non reassuring Fetal Heart Tracing  Patient with dysfunctional second stage and arrest of descent despite adequate pushing at 0-+1 station.  Patient has been full dilated for 5 hours without adequate descent.  Now there is repetitive late decelerations with loss of variability. Patient counseled that we will proceed with an urgent primary Cesarean Delivery  Patient agrees.   Marissa Jacobson, Marissa Jacobson 05/02/2013, 8:31 PM

## 2013-05-02 NOTE — Progress Notes (Signed)
Marissa Jacobson is a 33 y.o. G1P0 at 147w2d by LMP admitted for induction of labor due to Non-reactive NST and Gestational diabetes.  Subjective: Patient feels more rectal pressure  Objective: BP 119/74  Pulse 83  Temp(Src) 97.1 F (36.2 C) (Oral)  Resp 20  Ht 5\' 9"  (1.753 m)  Wt 102.513 kg (226 lb)  BMI 33.36 kg/m2  SpO2 100%      FHT:  FHR: 145 bpm, variability: moderate,  accelerations:  Present,  decelerations:  Present several variable decels nadir 90s lasting 1 minutes after AROM with return to baseline UC:   regular, every 2 minutes SVE:   Dilation: Lip/rim Effacement (%): 100 Station: -1 Exam by:: Dr Mora ApplPINN  Labs: Lab Results  Component Value Date   WBC 7.8 05/01/2013   HGB 10.4* 05/01/2013   HCT 31.3* 05/01/2013   MCV 74.5* 05/01/2013   PLT 196 05/01/2013    Assessment / Plan: Induction of labor due to term with favorable cervix, non-reassuring fetal testing and gestational diabetes,  progressing well on pitocin  Labor: Progressing normally Preeclampsia:  no signs or symptoms of toxicity Fetal Wellbeing:  Category II Pain Control:  Epidural I/D:  n/a Anticipated MOD:  NSVD  Marissa Jacobson 05/02/2013, 2:58 PM

## 2013-05-02 NOTE — Op Note (Addendum)
Cesarean Section Procedure Note  Indications: 33 yo G1P0 at 40 weeks 2 days with Non-Reassuring fetal heart tracing in second stage of labor.  Arrest of Descent at full dilation.   Pre-operative Diagnosis: Non Reassuring Fetal Heart Tracing, Arrest of Descent  Post-operative Diagnosis: same  Surgeon: Wynonia Hazard   Assistants: Jaynie Collins   Anesthesia: Epidural anesthesia  ASA Class: 2  Procedure Details   The patient was taken to the operating Room from Labor and Delivery. Previously on L&D The risks, benefits, complications, treatment options, and expected outcomes were discussed with the patient.  The patient concurred with the proposed plan, giving informed consent.  The site of surgery properly noted/marked. The patient was taken to identified as Marissa Jacobson and the procedure verified as C-Section Delivery. A Time Out was held and the above information confirmed.  After confirmation of the level of the epidural, the patient was placed in the dorsal supine position with a leftward tilt, draped and prepped in the usual sterile manner. A Pfannenstiel incision was made with the scalpel and carried down through the subcutaneous tissue to the fascia with the scalpel.  The fascia was incised in the midline and the fascial incision was extended laterally with Mayo scissors. The superior aspect of the fascial incision was grasped with Coker clamps x2, tented up and the rectus muscles dissected off sharply with the scalpel. The rectus was then dissected off with blunt dissection and Mayo scissors inferiorly. The rectus muscles were separated in the midline. The abdominal peritoneum was identified, tented up, entered sharply, and the incision was extended superiorly and inferiorly with good visualization of the bladder. The bowel was in the operative field and packed away with tagged moist laparotomy sponges x 2. The Alexis retractor was then deployed. The vesicouterine peritoneum was  identified, tented up, entered sharply with Metzenbaum scissors, and the bladder flap was created digitally. Scalpel was then used to make a low transverse incision on the uterus which was extended laterally with  blunt dissection. The fetal vertex was wedged in the maternal pelvis and the assisting circulating nurse gave assistance with a vaginal hand to lift the head out of the pelvis. The fetal vertex was then delivered through the abdomen. Four nuchal cords were reduced (two were tight) the rest of the body delivered without difficulty. A live female infant was born, limp and not crying. The cord was double clamped and cut and the infant handed to the waiting neonatologists. There was notable meconium not previously identified on AROM during labor. identified, delivered easily through the uterine incision followed by the body. Final Apgars 2/ 7/8. Cord gases were obtained and cord blood for typing. The Placenta was then delivered spontaneously, intact and appear normal and handed off to be sent to Pathology. The uterus was exteriorized,  cleared of all clot and debris. The uterine incision was repaired with #1 Monocryl in running locked fashion. There was an extension noted into the left broad ligament that was repaired in running, locked fashion with #0 Monocryl. A second imbricating suture of 0-Monocryl was performed.  There was oozing along the medial aspect of the incision which was alleviated by interrupted suture of 2-0 Vicryl. The ovaries and tubes were inspected and normal. The uterus was returned to the abdomen and the lap packs removed. The Alexis retractor was removed.  The abdominal peritoneum was reapproximated with 2-0 Vicryl in a running fashion. The fascia was closed in running fashion with 0-Vicryl. The subcutaneous layer was re-approximated  with inturrupted suture of 2-0 plain gut.  The skin was closed with 4-0 vicryl in a subcuticular fashion and dressed with benzoine, steri strips and pressure  dressing. All sponge lap and needle counts were correct x3. Patient tolerated the procedure well and was transferred to the PACU in good condition. Instrument, sponge, and needle counts were correct prior the abdominal closure and at the conclusion of the case.   Findings: Live female infant, Apgars2/7/8, thick meconium, normal appearing placenta, normal uterus, bilateral tubes and ovaries. Infant to NICU  Estimated Blood Loss:          Drains: Foley catheter                 Specimens: Placenta, cord gases         Implants: none         Complications:  None; patient tolerated the procedure well.         Disposition: PACU - hemodynamically stable.  Marissa Jacobson [name]   Addendum: Patient with chorioamnionitis diagnosis intrapartum. Tmax 101.9. Unasyn was initiated and will continue 24 hours post partum. Will discontinue in 24 hours if afebrile.   Marissa Jacobson [name]

## 2013-05-02 NOTE — Anesthesia Postprocedure Evaluation (Signed)
  Anesthesia Post Note  Patient: Marissa Jacobson  Procedure(s) Performed: Procedure(s) (LRB): CESAREAN SECTION (N/A)  Anesthesia type: Epidural  Patient location: PACU  Post pain: Pain level controlled  Post assessment: Post-op Vital signs reviewed  Last Vitals:  Filed Vitals:   05/02/13 2245  BP: 103/59  Pulse: 74  Temp:   Resp: 16    Post vital signs: Reviewed  Level of consciousness: awake  Complications: No apparent anesthesia complications

## 2013-05-02 NOTE — Progress Notes (Signed)
Discussed with Dr Mora ApplPinn about Anesthsia giving bolus to laboring down

## 2013-05-02 NOTE — Progress Notes (Signed)
Marissa Jacobson is a 33 y.o. G1P0 at 5232w2d by LMP admitted for induction of labor due to Non-reactive NST.  Subjective: Patient comfortable feels more pressure  Objective: BP 107/67  Pulse 77  Temp(Src) 98 F (36.7 C) (Oral)  Resp 18  Ht 5\' 9"  (1.753 m)  Wt 102.513 kg (226 lb)  BMI 33.36 kg/m2  SpO2 100%      FHT:  FHR: 140 bpm, variability: moderate,  accelerations:  Present,  decelerations:  Absent UC:   regular, every 1-2 minutes SVE:   Dilation: 9 Effacement (%): 100 Station: -1 Exam by:: Dr Mora ApplPinn  Labs: Lab Results  Component Value Date   WBC 7.8 05/01/2013   HGB 10.4* 05/01/2013   HCT 31.3* 05/01/2013   MCV 74.5* 05/01/2013   PLT 196 05/01/2013    Assessment / Plan: Induction of labor due to non-reassuring fetal testing and gestational diabetes,  progressing well on pitocin  Labor: Progressing on Pitocin, will let labor down then AROM Preeclampsia:  no signs or symptoms of toxicity Fetal Wellbeing:  Category I Pain Control:  Epidural I/D:  n/a Anticipated MOD:  NSVD  Lela Murfin [name] 05/02/2013, 10:26 AM

## 2013-05-02 NOTE — Transfer of Care (Signed)
Immediate Anesthesia Transfer of Care Note  Patient: Marissa Jacobson  Procedure(s) Performed: Procedure(s): CESAREAN SECTION (N/A)  Patient Location: PACU  Anesthesia Type:Epidural  Level of Consciousness: awake, alert  and oriented  Airway & Oxygen Therapy: Patient Spontanous Breathing  Post-op Assessment: Report given to PACU RN and Post -op Vital signs reviewed and stable  Post vital signs: Reviewed and stable  Complications: No apparent anesthesia complications

## 2013-05-03 ENCOUNTER — Encounter (HOSPITAL_COMMUNITY): Payer: Self-pay | Admitting: Pediatrics

## 2013-05-03 LAB — CBC
HCT: 25.3 % — ABNORMAL LOW (ref 36.0–46.0)
Hemoglobin: 8.3 g/dL — ABNORMAL LOW (ref 12.0–15.0)
MCH: 24.6 pg — ABNORMAL LOW (ref 26.0–34.0)
MCHC: 32.8 g/dL (ref 30.0–36.0)
MCV: 74.9 fL — AB (ref 78.0–100.0)
PLATELETS: 146 10*3/uL — AB (ref 150–400)
RBC: 3.38 MIL/uL — ABNORMAL LOW (ref 3.87–5.11)
RDW: 15.5 % (ref 11.5–15.5)
WBC: 15.6 10*3/uL — AB (ref 4.0–10.5)

## 2013-05-03 LAB — ABO/RH: ABO/RH(D): B POS

## 2013-05-03 MED ORDER — OXYCODONE HCL 5 MG PO TABS
10.0000 mg | ORAL_TABLET | ORAL | Status: DC | PRN
  Administered 2013-05-03: 10 mg via ORAL
  Filled 2013-05-03: qty 2

## 2013-05-03 MED ORDER — SENNOSIDES-DOCUSATE SODIUM 8.6-50 MG PO TABS
1.0000 | ORAL_TABLET | Freq: Every evening | ORAL | Status: DC | PRN
  Administered 2013-05-03: 1 via ORAL
  Administered 2013-05-05: 2 via ORAL
  Filled 2013-05-03: qty 1
  Filled 2013-05-03: qty 2

## 2013-05-03 MED ORDER — DIPHENHYDRAMINE HCL 25 MG PO CAPS
50.0000 mg | ORAL_CAPSULE | Freq: Four times a day (QID) | ORAL | Status: DC | PRN
  Administered 2013-05-03: 50 mg via ORAL
  Filled 2013-05-03: qty 2

## 2013-05-03 NOTE — Progress Notes (Signed)
Patient showered and tolerated well.  Pressure dressing removed.  Honey dressing clean, dry and intact.

## 2013-05-03 NOTE — Progress Notes (Signed)
Subjective: Postpartum Day 0-1: Cesarean Delivery Patient able to ambulate with assistance. She notes she feels some incisional pain, not severe.  No other complaints.   Objective: Vital signs in last 24 hours: Temp:  [97.1 F (36.2 C)-101.9 F (38.8 C)] 98 F (36.7 C) (03/29 0600) Pulse Rate:  [62-132] 95 (03/29 0600) Resp:  [16-20] 16 (03/29 0600) BP: (85-145)/(38-86) 115/59 mmHg (03/29 0600) SpO2:  [94 %-99 %] 98 % (03/29 0600)  Physical Exam:  General: alert, cooperative and no distress Abdomen: not examined Foley in place   Recent Labs  05/01/13 1755 05/03/13 0525  HGB 10.4* 8.3*  HCT 31.3* 25.3*    Assessment/Plan: Status post Cesarean section. Doing well postoperatively.  Continue current care. Unasyn for 24 hours. If afebrile from last temp 24 hours then will discontinue antibiotic.  Baby in NICU for low Apgars and Sepsis workup.    Essie HartINN, Tynika Luddy [name] 05/03/2013, 6:44 AM

## 2013-05-03 NOTE — Anesthesia Postprocedure Evaluation (Signed)
  Anesthesia Post-op Note  Patient: Marissa PhenixGoldie R Prosperi  Procedure(s) Performed: Procedure(s): CESAREAN SECTION (N/A)  Patient Location: [facility]  Anesthesia Type:Epidural  Level of Consciousness: awake, alert  and oriented  Airway and Oxygen Therapy: Patient Spontanous Breathing  Post-op Pain: none  Post-op Assessment: Post-op Vital signs reviewed, Patient's Cardiovascular Status Stable, Respiratory Function Stable, No headache, No backache, No residual numbness and No residual motor weakness  Post-op Vital Signs: Reviewed and stable  Complications: No apparent anesthesia complications

## 2013-05-04 ENCOUNTER — Encounter (HOSPITAL_COMMUNITY): Payer: Self-pay | Admitting: Obstetrics & Gynecology

## 2013-05-04 DIAGNOSIS — D649 Anemia, unspecified: Secondary | ICD-10-CM | POA: Diagnosis not present

## 2013-05-04 DIAGNOSIS — O411290 Chorioamnionitis, unspecified trimester, not applicable or unspecified: Secondary | ICD-10-CM

## 2013-05-04 NOTE — Lactation Note (Signed)
This note was copied from the chart of Marissa Bosie ClosGoldie Hollister. Lactation Consultation Note    Follow up consult with this mom of a term baby, now 42 hours post partum, and full term. Mom wants to breast feed, but has had a breast reduction in the past. She has been pumping, and not expressing any colostrum. I showed he how to hand express, and she was able to express about 1 mls of colostrum, which she  brought to her baby. Mom is aware that with her reduction, she may not be able to produce milk, but is willing to pump and try. I will follow this mom and baby, in the NICU   . Mom is active with WIC, and [facility]  Patient Name: Marissa Jacobson WGNFA'OToday's Date: 05/04/2013 Reason for consult: Follow-up assessment   Maternal Data Formula Feeding for Exclusion: Yes (baby in NICU) Infant to breast within first hour of birth: No Breastfeeding delayed due to:: Infant status Has patient been taught Hand Expression?: Yes  Feeding Feeding Type: Formula Nipple Type: Regular Length of feed: 35 min  LATCH Score/Interventions                      Lactation Tools Discussed/Used WIC Program: Yes   Consult Status Consult Status: Follow-up Date: 05/05/13 Follow-up type: In-patient    Alfred LevinsLee, Enrico Eaddy Anne 05/04/2013, 5:32 PM

## 2013-05-04 NOTE — Progress Notes (Signed)
Ur chart review completed.  

## 2013-05-04 NOTE — Progress Notes (Deleted)
Ur chart review completed.  

## 2013-05-04 NOTE — Progress Notes (Signed)
POD#2 Pt without complaints. Now off of abxs. States that baby is doing well in NICU. Tolerating diet. VSSAF IMP/ S/P LTCS, Stable, anemic. Plan/ Routine care.

## 2013-05-05 MED ORDER — DOCUSATE SODIUM 100 MG PO CAPS: 100.0000 mg | ORAL_CAPSULE | Freq: Two times a day (BID) | ORAL | Status: AC

## 2013-05-05 MED ORDER — OXYCODONE-ACETAMINOPHEN 5-325 MG PO TABS: 2.0000 | ORAL_TABLET | ORAL | Status: AC | PRN

## 2013-05-05 MED ORDER — IBUPROFEN 600 MG PO TABS: 600.0000 mg | ORAL_TABLET | Freq: Four times a day (QID) | ORAL | Status: AC | PRN

## 2013-05-05 NOTE — Progress Notes (Signed)
05/05/13 1300  Clinical Encounter Type  Visited With Patient and family together (baby's dad)  Visit Type Initial  Referral From Social work  Spiritual Encounters  Spiritual Needs Emotional   Made initial visit to introduce [place] and offer emotional/spiritual support.  [name] was tired but in good spirits.  She and baby's dad report good support from her family (his is not local).  Family is aware of ongoing chaplain availability, but please also page as needs arise.  Thank you.  [address] Young CourtChaplain Callista Hoh BoulevardLundeen, South DakotaMDiv [phone]

## 2013-05-05 NOTE — Progress Notes (Signed)
Pt/ is discharged in the care of Mother. Downstairs per ambulatory. No equipment needed for home use.Stable.Abdominal dressing is clean and dry. Discharge instructions withRx were given to pt with good understanding Questions were asked and answered. Infant to remain in NICu..Marland Kitchen

## 2013-05-05 NOTE — Lactation Note (Signed)
This note was copied from the chart of Marissa Bosie ClosGoldie Willig. Lactation Consultation Note   Follow up consult with this mom of a term NICU baby, now 65 hours post partum. Mom has had a breast reduction in the past, but is beginning to produce some milk. With hand expression, she has a good stream, but was able to collect 1-2 mls. I assisted mom with latching her baby today. H was on and off the breast for 10 minutes, with intermittent sucking. I tried a 24 nipple shield, but it would not stay on, and mom did not like it. The 20 shield was to small. Mom has soft breasts with flat nipples, making it difficult for the baby to latch and stay latched.  If mom wants, I will try an SNS later this week, and see if we can get him to maintain a latch. [facility], and is using a hand pump for tonight. I will follow her in the NICU.  Mom was sad because she was going home without her baby, and he needs to stay for 4 more days of antibiotics.   Patient Name: Marissa Jacobson ZOXWR'UToday's Date: 05/05/2013 Reason for consult: Follow-up assessment;NICU baby   Maternal Data    Feeding Feeding Type: Formula Nipple Type: Regular Length of feed: 15 min  LATCH Score/Interventions Latch: Repeated attempts needed to sustain latch, nipple held in mouth throughout feeding, stimulation needed to elicit sucking reflex. Intervention(s): Adjust position;Assist with latch;Breast massage;Breast compression  Audible Swallowing: None Intervention(s): Skin to skin;Hand expression  Type of Nipple: Flat (history of breast reduction) Intervention(s): Double electric pump;Hand pump  Comfort (Breast/Nipple): Soft / non-tender     Hold (Positioning): Assistance needed to correctly position infant at breast and maintain latch.  LATCH Score: 5  Lactation Tools Discussed/Used Tools: Nipple Dorris CarnesShields;Pump Nipple shield size: 20;24 Breast pump type: Manual WIC Program: Yes (has appointment for 4/1 at 0830 to  get DEP)   Consult Status Consult Status: Follow-up Follow-up type: In-patient (prn in NICU)    Alfred LevinsLee, Trenise Turay Anne 05/05/2013, 2:59 PM

## 2013-05-05 NOTE — Discharge Summary (Signed)
Obstetric Discharge Summary Reason for Admission: induction of labor Prenatal Procedures: NST and ultrasound Intrapartum Procedures: cesarean: low cervical, transverse Postpartum Procedures: none Complications-Operative and Postpartum: none Hemoglobin  Date Value Ref Range Status  05/03/2013 8.3* 12.0 - 15.0 g/dL Final     REPEATED TO VERIFY     DELTA CHECK NOTED  02/03/2013 9.2   Final     HCT  Date Value Ref Range Status  05/03/2013 25.3* 36.0 - 46.0 % Final  02/03/2013 29   Final    Physical Exam:  General: alert, cooperative and appears stated age Lochia: appropriate Uterine Fundus: firm Incision: healing well DVT Evaluation: No evidence of DVT seen on physical exam.  Discharge Diagnoses: Term Pregnancy-delivered  Discharge Information: Date: 05/05/2013 Activity: pelvic rest Diet: routine Medications: Ibuprofen, Colace and Percocet Condition: improved Instructions: refer to practice specific booklet Discharge to: home Follow-up Information   Follow up with [name], Marissa MaeWALDA [name], MD In 4 weeks. (For a postpartum evaluation)    Specialty:  Obstetrics and Gynecology   Contact information:   [address] [place] [id number] [phone]       Newborn Data: Live born female  Birth Weight: 8 lb 5.6 oz (3788 g) APGAR: 2, 7  Home with mother.  Marissa Jacobson H. 05/05/2013, 6:03 PM

## 2013-05-05 NOTE — Progress Notes (Signed)
Post Partum Day 3 Subjective: no complaints, up ad lib, voiding and tolerating PO  Objective: Blood pressure 118/68, pulse 102, temperature 97.8 F (36.6 C), temperature source Oral, resp. rate 16, height 5\' 9"  (1.753 m), weight 102.513 kg (226 lb), SpO2 97.00%, unknown if currently breastfeeding.  Physical Exam:  General: alert, cooperative and appears stated age Lochia: appropriate Uterine Fundus: firm Incision: healing well DVT Evaluation: No evidence of DVT seen on physical exam.   Recent Labs  05/03/13 0525  HGB 8.3*  HCT 25.3*    Assessment/Plan: 1) POD #3, baby still in NICU. Pt anxious about leaving hospital without her baby. Unsure if she is ready to go home. Pt understands if she stays today her baby may not be discharged tomorrow.     LOS: 4 days   Tanyah Debruyne H. 05/05/2013, 12:14 PM

## 2013-05-18 ENCOUNTER — Encounter (HOSPITAL_COMMUNITY): Payer: Self-pay | Admitting: Obstetrics & Gynecology

## 2013-05-18 NOTE — Addendum Note (Signed)
Addendum created 05/18/13 0813 by Brayton CavesFreeman Manuelito Poage, MD   Modules edited: Anesthesia Events, Anesthesia Responsible Staff

## 2013-05-23 ENCOUNTER — Encounter (HOSPITAL_COMMUNITY): Payer: Self-pay | Admitting: *Deleted

## 2013-05-23 ENCOUNTER — Inpatient Hospital Stay (HOSPITAL_COMMUNITY)
Admission: AD | Admit: 2013-05-23 | Discharge: 2013-05-24 | Disposition: A | Discharge status: Home / Self Care | Source: Ambulatory Visit | Attending: Obstetrics & Gynecology | Admitting: Obstetrics & Gynecology

## 2013-05-23 DIAGNOSIS — L7682 Other postprocedural complications of skin and subcutaneous tissue: Secondary | ICD-10-CM

## 2013-05-23 DIAGNOSIS — R209 Unspecified disturbances of skin sensation: Secondary | ICD-10-CM

## 2013-05-23 DIAGNOSIS — Z87891 Personal history of nicotine dependence: Secondary | ICD-10-CM | POA: Insufficient documentation

## 2013-05-23 DIAGNOSIS — O909 Complication of the puerperium, unspecified: Secondary | ICD-10-CM | POA: Insufficient documentation

## 2013-05-23 MED ORDER — BACITRACIN 500 UNIT/GM EX OINT: 1.0000 "application " | TOPICAL_OINTMENT | Freq: Two times a day (BID) | CUTANEOUS | Status: AC

## 2013-05-23 MED ORDER — CEPHALEXIN 500 MG PO CAPS: 500.0000 mg | ORAL_CAPSULE | Freq: Four times a day (QID) | ORAL | Status: DC

## 2013-05-23 NOTE — MAU Provider Note (Signed)
History     CSN: [id number]  Arrival date and time: 05/23/13 2306   First Provider Initiated Contact with Patient 05/23/13 2340      Chief Complaint  Patient presents with  . Postpartum Complications   HPI  Pt is a 33 yo G1P1001 s/p a primary csection on 05/02/13 due to failure to descend.  Pt reports that she was feeling along incision today and felt an "opening".  Looked at incision and saw an area that appeared open.  Reports tenderness at the site and odor.  No report of fever, body aches, or chills.    Past Medical History  Diagnosis Date  . Gestational diabetes mellitus, antepartum     Past Surgical History  Procedure Laterality Date  . Breast reduction surgery  07/2010  . Cesarean section N/A 05/02/2013    Procedure: CESAREAN SECTION;  Surgeon: Essie HartWalda [name], MD;  Location: [facility];  Service: Obstetrics;  Laterality: N/A;    Family History  Problem Relation Age of Onset  . Miscarriages / Stillbirths Sister   . Cancer Paternal Grandfather     History  Substance Use Topics  . Smoking status: Former Games developermoker  . Smokeless tobacco: Never Used  . Alcohol Use: Yes     Comment: social drinker, over a year since last alcohol    Allergies:  Allergies  Allergen Reactions  . Latex Itching    Prescriptions prior to admission  Medication Sig Dispense Refill  . docusate sodium (COLACE) 100 MG capsule Take 1 capsule (100 mg total) by mouth 2 (two) times daily.  60 capsule  0  . ferrous sulfate 325 (65 FE) MG tablet Take 325 mg by mouth every other day.      . ibuprofen (ADVIL,MOTRIN) 600 MG tablet Take 1 tablet (600 mg total) by mouth every 6 (six) hours as needed.  90 tablet  0  . oxyCODONE-acetaminophen (ROXICET) 5-325 MG per tablet Take 2 tablets by mouth every 4 (four) hours as needed for severe pain. May take 1-2 tablets every 4-6 hours as needed for pain  46 tablet  0  . Prenatal Vit-Fe Fumarate-FA (PRENATAL MULTIVITAMIN) TABS tablet Take 1 tablet by mouth daily at 12  noon.        Review of Systems  Constitutional: Negative for fever and chills.  Gastrointestinal: Negative for abdominal pain.  Skin:       Incision skin pain  All other systems reviewed and are negative.  Physical Exam   Blood pressure 133/88, pulse 108, temperature 98.8 F (37.1 C), temperature source Oral, resp. rate 20, SpO2 99.00%, unknown if currently breastfeeding.  Physical Exam  Constitutional: She is oriented to person, place, and time. She appears well-developed and well-nourished. No distress.  HENT:  Head: Normocephalic.  Neck: Normal range of motion. Neck supple.  GI: Soft. There is no tenderness. There is no guarding.  Incision site healing well; well approximated, no areas along incision that's open.  Small area on incision that appears red with small ulcer (approx 0.5 cm) like appearance.  Tender with palpation.  Genitourinary: No bleeding around the vagina.  Neurological: She is alert and oriented to person, place, and time. She has normal reflexes.  Skin: Skin is warm and dry.    MAU Course  Procedures  2350 Dr. Mora ApplPinn called and given report on HPI/exam > discharge home with PO Keflex and apply Bacitracin oint to area > schedule appt in office early next week for follow-up. Assessment and Plan  Incisional Pain ?  Infection  Plan: Discharge home RX Keflex 500 mg QID x 7 days Apply Bacitracin to area Follow-up in office early next week.  Melissa NoonWalidah N Muhammad 05/23/2013, 11:42 PM

## 2013-05-23 NOTE — MAU Note (Signed)
C-section on 3/28. Patient c/o odor coming from c-section incision and states it starting to open. Denies fever, chills and vaginal bleeding.

## 2013-05-24 NOTE — MAU Provider Note (Signed)
Reviewed case with CNM and I agree with above note.   Betsey Sossamon  

## 2013-12-07 ENCOUNTER — Encounter (HOSPITAL_COMMUNITY): Payer: Self-pay | Admitting: *Deleted

## 2019-06-17 ENCOUNTER — Ambulatory Visit: Admitting: Dermatology

## 2019-06-17 ENCOUNTER — Other Ambulatory Visit: Payer: Self-pay

## 2019-07-20 ENCOUNTER — Ambulatory Visit: Payer: Self-pay | Admitting: Dermatology

## 2020-08-01 ENCOUNTER — Other Ambulatory Visit: Payer: Self-pay | Admitting: Family Medicine

## 2020-08-01 ENCOUNTER — Other Ambulatory Visit (HOSPITAL_COMMUNITY)
Admission: RE | Admit: 2020-08-01 | Discharge: 2020-08-01 | Disposition: A | Discharge status: Home / Self Care | Payer: BC Managed Care – PPO | Source: Ambulatory Visit | Attending: Family Medicine | Admitting: Family Medicine

## 2020-08-01 DIAGNOSIS — Z01411 Encounter for gynecological examination (general) (routine) with abnormal findings: Secondary | ICD-10-CM | POA: Diagnosis not present

## 2020-08-05 LAB — CYTOLOGY - PAP
Comment: NEGATIVE
Diagnosis: UNDETERMINED — AB
High risk HPV: NEGATIVE

## 2022-09-17 ENCOUNTER — Inpatient Hospital Stay: Payer: BC Managed Care – PPO

## 2022-09-17 ENCOUNTER — Encounter: Payer: Self-pay | Admitting: Family

## 2022-09-17 ENCOUNTER — Other Ambulatory Visit: Payer: Self-pay

## 2022-09-17 ENCOUNTER — Inpatient Hospital Stay: Payer: BC Managed Care – PPO | Attending: Family | Admitting: Family

## 2022-09-17 VITALS — BP 117/72 | HR 68 | Temp 98.1°F | Resp 17

## 2022-09-17 DIAGNOSIS — Z803 Family history of malignant neoplasm of breast: Secondary | ICD-10-CM | POA: Diagnosis not present

## 2022-09-17 DIAGNOSIS — D500 Iron deficiency anemia secondary to blood loss (chronic): Secondary | ICD-10-CM

## 2022-09-17 DIAGNOSIS — D509 Iron deficiency anemia, unspecified: Secondary | ICD-10-CM | POA: Insufficient documentation

## 2022-09-17 DIAGNOSIS — Z87891 Personal history of nicotine dependence: Secondary | ICD-10-CM | POA: Diagnosis not present

## 2022-09-17 NOTE — Progress Notes (Unsigned)
Hematology/Oncology Consultation   Name: Marissa Jacobson      MRN: [id number]    Location: Room/bed info not found  Date: 09/17/2022 Time:3:45 PM   REFERRING PHYSICIAN: Farris Has, MD  REASON FOR CONSULT:  Anemia    DIAGNOSIS: Iron deficiency anemia   HISTORY OF PRESENT ILLNESS:  Marissa Jacobson is a very pleasant 42 yo African American female with history of iron deficiency anemia. She has tried oral iron in the past but this caused GI upset.  Recent iron saturation was only 8%. Hgb 9.4, MCV 71, platelets 355 and WBC count 3.5.  She has a regular cycle with heavy flow. No other blood loss noted.  No abnormal bruising, no petechiae.  She has had a C-section and breast reduction in the past without any complications.  She is symptomatic at this time with fatigue, SOB with exertion, dizziness, craving dirt, palpitations and intermittent numbness and tingling in Marissa toes.  Marissa Jacobson also have anemia.  She has Marissa Jacobson and history of 1 miscarriage.  She is due for Marissa mammogram.  No personal history of cancer. Marissa maternal grandmother had breast cancer and maternal grandfather had an unknown primary.  No history of diabetes or thyroid disease.  No fever, chills, n/v, cough, rash, chest pain, abdominal pain or changes in bowel or bladder habits.  No swelling or tenderness in Marissa extremities.  No falls or syncope reported.  No smoking, ETOH or recreational drug use.  Appetite and hydration are good.   ROS: All other 10 point review of systems is negative.   PAST MEDICAL HISTORY:   Past Medical History:  Diagnosis Date   Gestational diabetes mellitus, antepartum     ALLERGIES: Allergies  Allergen Reactions   Latex Itching      MEDICATIONS:  Current Outpatient Medications on File Prior to Visit  Medication Sig Dispense Refill   bacitracin 500 UNIT/GM ointment Apply 1 application topically 2 (two) times daily. 15 g 0   docusate sodium (COLACE) 100 MG capsule Take 1 capsule (100 mg  total) by mouth 2 (two) times daily. 60 capsule 0   ferrous sulfate 325 (65 FE) MG tablet Take 325 mg by mouth every other day.     ibuprofen (ADVIL,MOTRIN) 600 MG tablet Take 1 tablet (600 mg total) by mouth every 6 (six) hours as needed. 90 tablet 0   Prenatal Vit-Fe Fumarate-FA (PRENATAL MULTIVITAMIN) TABS tablet Take 1 tablet by mouth daily at 12 noon.     oxyCODONE-acetaminophen (ROXICET) 5-325 MG per tablet Take 2 tablets by mouth every 4 (four) hours as needed for severe pain. May take 1-2 tablets every 4-6 hours as needed for pain (Patient not taking: Reported on 09/17/2022) 46 tablet 0   No current facility-administered medications on file prior to visit.     PAST SURGICAL HISTORY Past Surgical History:  Procedure Laterality Date   BREAST REDUCTION SURGERY  07/2010   CESAREAN SECTION N/A 05/02/2013   Procedure: CESAREAN SECTION;  Surgeon: Essie Hart, MD;  Location: [facility];  Service: Obstetrics;  Laterality: N/A;    FAMILY HISTORY: Family History  Problem Relation Age of Onset   Miscarriages / Stillbirths Sister    Cancer Paternal Grandfather     SOCIAL HISTORY:  reports that she has quit smoking. She has never used smokeless tobacco. She reports current alcohol use. She reports that she does not use drugs.  PERFORMANCE STATUS: The patient's performance status is 1 - Symptomatic but completely ambulatory  PHYSICAL EXAM: Most  Recent Vital Signs: Blood pressure 117/72, pulse 68, temperature 98.1 F (36.7 C), temperature source Oral, resp. rate 17, SpO2 99%, unknown if currently breastfeeding. BP 117/72 (BP Location: Right Arm, Patient Position: Sitting)   Pulse 68   Temp 98.1 F (36.7 C) (Oral)   Resp 17   SpO2 99%   General Appearance:    Alert, cooperative, no distress, appears stated age  Head:    Normocephalic, without obvious abnormality, atraumatic  Eyes:    PERRL, conjunctiva/corneas clear, EOM's intact, fundi    benign, both eyes        Throat:   Lips, mucosa,  and tongue normal; teeth and gums normal  Neck:   Supple, symmetrical, trachea midline, no adenopathy;    thyroid:  no enlargement/tenderness/nodules; no carotid   bruit or JVD  Back:     Symmetric, no curvature, ROM normal, no CVA tenderness  Lungs:     Clear to auscultation bilaterally, respirations unlabored  Chest Wall:    No tenderness or deformity   Heart:    Regular rate and rhythm, S1 and S2 normal, no murmur, rub   or gallop     Abdomen:     Soft, non-tender, bowel sounds active all four quadrants,    no masses, no organomegaly        Extremities:   Extremities normal, atraumatic, no cyanosis or edema  Pulses:   2+ and symmetric all extremities  Skin:   Skin color, texture, turgor normal, no rashes or lesions  Lymph nodes:   Cervical, supraclavicular, and axillary nodes normal  Neurologic:   CNII-XII intact, normal strength, sensation and reflexes    throughout    LABORATORY DATA:  No results found for this or any previous visit (from the past 48 hour(s)).    RADIOGRAPHY: No results found.     PATHOLOGY: None  ASSESSMENT/PLAN: Marissa Jacobson is a very pleasant 42 yo [place] American female with history of iron deficiency anemia.  We will get Marissa set up for 3 doses of IV iron.  Follow-up in 8 weeks.   All questions were answered. The patient knows to call the clinic with any problems, questions or concerns. We can certainly see the patient much sooner if necessary.   Eileen Stanford, NP

## 2022-09-19 ENCOUNTER — Other Ambulatory Visit: Payer: Self-pay | Admitting: Family

## 2022-09-19 DIAGNOSIS — D509 Iron deficiency anemia, unspecified: Secondary | ICD-10-CM | POA: Insufficient documentation

## 2022-09-19 DIAGNOSIS — D500 Iron deficiency anemia secondary to blood loss (chronic): Secondary | ICD-10-CM

## 2022-09-26 ENCOUNTER — Inpatient Hospital Stay: Payer: BC Managed Care – PPO

## 2022-09-26 VITALS — BP 127/78 | HR 72 | Temp 98.6°F | Resp 19

## 2022-09-26 DIAGNOSIS — D509 Iron deficiency anemia, unspecified: Secondary | ICD-10-CM | POA: Diagnosis not present

## 2022-09-26 DIAGNOSIS — D500 Iron deficiency anemia secondary to blood loss (chronic): Secondary | ICD-10-CM

## 2022-09-26 MED ORDER — SODIUM CHLORIDE 0.9 % IV SOLN
300.0000 mg | Freq: Once | INTRAVENOUS | Status: AC
  Administered 2022-09-26: 300 mg via INTRAVENOUS
  Filled 2022-09-26: qty 300

## 2022-09-26 MED ORDER — SODIUM CHLORIDE 0.9 % IV SOLN: Freq: Once | INTRAVENOUS | Status: AC

## 2022-09-26 NOTE — Patient Instructions (Signed)
 Iron Sucrose Injection What is this medication? IRON SUCROSE (EYE ern SOO krose) treats low levels of iron (iron deficiency anemia) in people with kidney disease. Iron is a mineral that plays an important role in making red blood cells, which carry oxygen from your lungs to the rest of your body. This medicine may be used for other purposes; ask your health care provider or pharmacist if you have questions. COMMON BRAND NAME(S): Venofer What should I tell my care team before I take this medication? They need to know if you have any of these conditions: Anemia not caused by low iron levels Heart disease High levels of iron in the blood Kidney disease Liver disease An unusual or allergic reaction to iron, other medications, foods, dyes, or preservatives Pregnant or trying to get pregnant Breastfeeding How should I use this medication? This medication is for infusion into a vein. It is given in a hospital or clinic setting. Talk to your care team about the use of this medication in children. While this medication may be prescribed for children as young as 2 years for selected conditions, precautions do apply. Overdosage: If you think you have taken too much of this medicine contact a poison control center or emergency room at once. NOTE: This medicine is only for you. Do not share this medicine with others. What if I miss a dose? Keep appointments for follow-up doses. It is important not to miss your dose. Call your care team if you are unable to keep an appointment. What may interact with this medication? Do not take this medication with any of the following: Deferoxamine Dimercaprol Other iron products This medication may also interact with the following: Chloramphenicol Deferasirox This list may not describe all possible interactions. Give your health care provider a list of all the medicines, herbs, non-prescription drugs, or dietary supplements you use. Also tell them if you smoke,  drink alcohol, or use illegal drugs. Some items may interact with your medicine. What should I watch for while using this medication? Visit your care team regularly. Tell your care team if your symptoms do not start to get better or if they get worse. You may need blood work done while you are taking this medication. You may need to follow a special diet. Talk to your care team. Foods that contain iron include: whole grains/cereals, dried fruits, beans, or peas, leafy green vegetables, and organ meats (liver, kidney). What side effects may I notice from receiving this medication? Side effects that you should report to your care team as soon as possible: Allergic reactions--skin rash, itching, hives, swelling of the face, lips, tongue, or throat Low blood pressure--dizziness, feeling faint or lightheaded, blurry vision Shortness of breath Side effects that usually do not require medical attention (report to your care team if they continue or are bothersome): Flushing Headache Joint pain Muscle pain Nausea Pain, redness, or irritation at injection site This list may not describe all possible side effects. Call your doctor for medical advice about side effects. You may report side effects to FDA at 1-800-FDA-1088. Where should I keep my medication? This medication is given in a hospital or clinic. It will not be stored at home. NOTE: This sheet is a summary. It may not cover all possible information. If you have questions about this medicine, talk to your doctor, pharmacist, or health care provider.  2024 Elsevier/Gold Standard (2022-06-29 00:00:00)

## 2022-10-03 ENCOUNTER — Inpatient Hospital Stay: Payer: BC Managed Care – PPO

## 2022-10-10 ENCOUNTER — Inpatient Hospital Stay: Payer: BC Managed Care – PPO | Attending: Hematology & Oncology

## 2022-10-10 VITALS — BP 121/76 | HR 85 | Temp 98.6°F | Resp 18

## 2022-10-10 DIAGNOSIS — D500 Iron deficiency anemia secondary to blood loss (chronic): Secondary | ICD-10-CM

## 2022-10-10 DIAGNOSIS — D509 Iron deficiency anemia, unspecified: Secondary | ICD-10-CM | POA: Diagnosis present

## 2022-10-10 MED ORDER — SODIUM CHLORIDE 0.9 % IV SOLN: Freq: Once | INTRAVENOUS | Status: AC

## 2022-10-10 MED ORDER — SODIUM CHLORIDE 0.9 % IV SOLN
300.0000 mg | Freq: Once | INTRAVENOUS | Status: AC
  Administered 2022-10-10: 300 mg via INTRAVENOUS
  Filled 2022-10-10: qty 300

## 2022-10-17 ENCOUNTER — Inpatient Hospital Stay: Payer: BC Managed Care – PPO

## 2022-10-18 ENCOUNTER — Inpatient Hospital Stay: Payer: BC Managed Care – PPO

## 2022-10-18 VITALS — BP 118/64 | HR 75 | Temp 97.6°F | Resp 17

## 2022-10-18 DIAGNOSIS — D500 Iron deficiency anemia secondary to blood loss (chronic): Secondary | ICD-10-CM

## 2022-10-18 DIAGNOSIS — D509 Iron deficiency anemia, unspecified: Secondary | ICD-10-CM | POA: Diagnosis not present

## 2022-10-18 MED ORDER — SODIUM CHLORIDE 0.9 % IV SOLN
300.0000 mg | Freq: Once | INTRAVENOUS | Status: AC
  Administered 2022-10-18: 300 mg via INTRAVENOUS
  Filled 2022-10-18: qty 300

## 2022-10-18 MED ORDER — SODIUM CHLORIDE 0.9 % IV SOLN: Freq: Once | INTRAVENOUS | Status: AC

## 2022-10-18 NOTE — Patient Instructions (Signed)
Iron Sucrose Injection What is this medication? IRON SUCROSE (EYE ern SOO krose) treats low levels of iron (iron deficiency anemia) in people with kidney disease. Iron is a mineral that plays an important role in making red blood cells, which carry oxygen from your lungs to the rest of your body. This medicine may be used for other purposes; ask your health care provider or pharmacist if you have questions. COMMON BRAND NAME(S): Venofer What should I tell my care team before I take this medication? They need to know if you have any of these conditions: Anemia not caused by low iron levels Heart disease High levels of iron in the blood Kidney disease Liver disease An unusual or allergic reaction to iron, other medications, foods, dyes, or preservatives Pregnant or trying to get pregnant Breastfeeding How should I use this medication? This medication is for infusion into a vein. It is given in a hospital or clinic setting. Talk to your care team about the use of this medication in children. While this medication may be prescribed for children as young as 2 years for selected conditions, precautions do apply. Overdosage: If you think you have taken too much of this medicine contact a poison control center or emergency room at once. NOTE: This medicine is only for you. Do not share this medicine with others. What if I miss a dose? Keep appointments for follow-up doses. It is important not to miss your dose. Call your care team if you are unable to keep an appointment. What may interact with this medication? Do not take this medication with any of the following: Deferoxamine Dimercaprol Other iron products This medication may also interact with the following: Chloramphenicol Deferasirox This list may not describe all possible interactions. Give your health care provider a list of all the medicines, herbs, non-prescription drugs, or dietary supplements you use. Also tell them if you smoke,  drink alcohol, or use illegal drugs. Some items may interact with your medicine. What should I watch for while using this medication? Visit your care team regularly. Tell your care team if your symptoms do not start to get better or if they get worse. You may need blood work done while you are taking this medication. You may need to follow a special diet. Talk to your care team. Foods that contain iron include: whole grains/cereals, dried fruits, beans, or peas, leafy green vegetables, and organ meats (liver, kidney). What side effects may I notice from receiving this medication? Side effects that you should report to your care team as soon as possible: Allergic reactions--skin rash, itching, hives, swelling of the face, lips, tongue, or throat Low blood pressure--dizziness, feeling faint or lightheaded, blurry vision Shortness of breath Side effects that usually do not require medical attention (report to your care team if they continue or are bothersome): Flushing Headache Joint pain Muscle pain Nausea Pain, redness, or irritation at injection site This list may not describe all possible side effects. Call your doctor for medical advice about side effects. You may report side effects to FDA at 1-800-FDA-1088. Where should I keep my medication? This medication is given in a hospital or clinic. It will not be stored at home. NOTE: This sheet is a summary. It may not cover all possible information. If you have questions about this medicine, talk to your doctor, pharmacist, or health care provider.  2024 [employer]vier/Gold Standard (2022-06-29 00:00:00)

## 2022-11-13 ENCOUNTER — Inpatient Hospital Stay: Payer: BC Managed Care – PPO | Admitting: Medical Oncology

## 2022-11-13 ENCOUNTER — Inpatient Hospital Stay: Payer: BC Managed Care – PPO | Attending: Hematology & Oncology

## 2022-11-13 ENCOUNTER — Other Ambulatory Visit: Payer: Self-pay

## 2022-11-13 DIAGNOSIS — D500 Iron deficiency anemia secondary to blood loss (chronic): Secondary | ICD-10-CM

## 2022-11-14 ENCOUNTER — Encounter: Payer: Self-pay | Admitting: Medical Oncology

## 2023-02-04 ENCOUNTER — Encounter: Payer: Self-pay | Admitting: Family

## 2023-04-15 ENCOUNTER — Encounter: Payer: Self-pay | Admitting: Family
